# Patient Record
Sex: Male | Born: 1940 | Race: White | Hispanic: No | Marital: Married | Smoking: Never smoker
Health system: Southern US, Community
[De-identification: ages and names within clinical notes are randomized; demographics above are authoritative.]

## PROBLEM LIST (undated history)

## (undated) DIAGNOSIS — R350 Frequency of micturition: Secondary | ICD-10-CM

## (undated) DIAGNOSIS — Z8673 Personal history of transient ischemic attack (TIA), and cerebral infarction without residual deficits: Secondary | ICD-10-CM

## (undated) DIAGNOSIS — Z973 Presence of spectacles and contact lenses: Secondary | ICD-10-CM

## (undated) DIAGNOSIS — J45909 Unspecified asthma, uncomplicated: Secondary | ICD-10-CM

## (undated) DIAGNOSIS — I10 Essential (primary) hypertension: Secondary | ICD-10-CM

## (undated) DIAGNOSIS — E785 Hyperlipidemia, unspecified: Secondary | ICD-10-CM

## (undated) DIAGNOSIS — N400 Enlarged prostate without lower urinary tract symptoms: Secondary | ICD-10-CM

## (undated) DIAGNOSIS — N320 Bladder-neck obstruction: Secondary | ICD-10-CM

## (undated) DIAGNOSIS — E119 Type 2 diabetes mellitus without complications: Secondary | ICD-10-CM

## (undated) DIAGNOSIS — Z974 Presence of external hearing-aid: Secondary | ICD-10-CM

## (undated) DIAGNOSIS — J309 Allergic rhinitis, unspecified: Secondary | ICD-10-CM

## (undated) HISTORY — PX: TYMPANIC MEMBRANE REPAIR: SHX294

---

## 2000-06-27 ENCOUNTER — Encounter: Payer: Self-pay | Admitting: Internal Medicine

## 2000-06-27 ENCOUNTER — Encounter: Admission: RE | Admit: 2000-06-27 | Discharge: 2000-06-27 | Payer: Self-pay | Admitting: Internal Medicine

## 2001-06-27 ENCOUNTER — Encounter (INDEPENDENT_AMBULATORY_CARE_PROVIDER_SITE_OTHER): Payer: Self-pay | Admitting: *Deleted

## 2001-06-27 ENCOUNTER — Ambulatory Visit (HOSPITAL_COMMUNITY): Admission: RE | Admit: 2001-06-27 | Discharge: 2001-06-27 | Payer: Self-pay | Admitting: *Deleted

## 2002-05-18 ENCOUNTER — Encounter: Admission: RE | Admit: 2002-05-18 | Discharge: 2002-05-18 | Payer: Self-pay | Admitting: Internal Medicine

## 2002-05-18 ENCOUNTER — Encounter: Payer: Self-pay | Admitting: Internal Medicine

## 2010-11-06 ENCOUNTER — Ambulatory Visit
Admission: RE | Admit: 2010-11-06 | Discharge: 2010-11-06 | Payer: Self-pay | Source: Home / Self Care | Attending: Urology | Admitting: Urology

## 2010-11-06 HISTORY — PX: OTHER SURGICAL HISTORY: SHX169

## 2010-11-09 LAB — GLUCOSE, CAPILLARY: Glucose-Capillary: 104 mg/dL — ABNORMAL HIGH (ref 70–99)

## 2010-11-10 LAB — POCT I-STAT, CHEM 8
BUN: 15 mg/dL (ref 6–23)
Calcium, Ion: 1.14 mmol/L (ref 1.12–1.32)
Chloride: 107 mEq/L (ref 96–112)
Creatinine, Ser: 1.1 mg/dL (ref 0.4–1.5)
Glucose, Bld: 132 mg/dL — ABNORMAL HIGH (ref 70–99)
HCT: 43 % (ref 39.0–52.0)
Hemoglobin: 14.6 g/dL (ref 13.0–17.0)
Potassium: 4 mEq/L (ref 3.5–5.1)
Sodium: 139 mEq/L (ref 135–145)
TCO2: 26 mmol/L (ref 0–100)

## 2010-11-13 NOTE — Op Note (Signed)
  NAME:  Adrian Bailey, GRAS NO.:  [id number]  MEDICAL RECORD NO.:  [id number]          PATIENT TYPE:  AMB  LOCATION:  [facility]                         FACILITY:  [facility]  PHYSICIAN:  Maretta Bees. Vonita Moss, M.D.DATE OF BIRTH:  25-Mar-1941  DATE OF PROCEDURE:  11/06/2010 DATE OF DISCHARGE:                              OPERATIVE REPORT   PREOPERATIVE DIAGNOSIS:  Recurrent hematuria.  POSTOPERATIVE DIAGNOSIS:  Recurrent hematuria.  PROCEDURE:  Cystoscopy, fulguration of bladder neck bleeders and cystogram and collection of urine cytology.  SURGEON:  Maretta Bees. Vonita Moss, M.D.  ANESTHESIA:  General.  INDICATIONS:  This gentleman has had recurrent hematuria.  He has a large prostate and is being treated with a combination of Avodart and tamsulosin.  Previous cystoscopy revealed no evidence of bladder tumors. CT scan was unremarkable except for a large left-sided bladder diverticulum with poor visualization of the distal aspect.  I felt that cystoscopy with visualization of his diverticulum was necessary.  PROCEDURE:  The patient is brought to the operating room and placed in lithotomy position.  The external genitalia were prepped and draped in usual fashion.  He was cystoscoped and the anterior urethra was normal. The prostate had large lateral lobes and the bladder neck bled quite easily.  I needed to irrigate the bladder and fulgurate the bladder neck with the Bugbee electrode to lessen his hematuria.  I then cystoscoped him with the flexible scope and could not reach the opening of the diverticulum with the scope itself.  I then inserted a guidewire into the diverticulum, but still because of the large prostate and the fact that the diverticulum entry was high up on the left bladder wall, I still could not enter the diverticulum.  I ended up using a flexible ureteroscope and after manipulating the guidewire on a couple of occasions, I finally was able to direct the  ureteroscope into the diverticulum.  Visually and fluoroscopically, I could determine that I was able to visualize the distal aspect of this diverticulum.  I saw no definite lesion.  There was some sediment and might have explained for the poor filling.  It was hard to expand the diverticulum well with the slower flow with the ureteroscope.  There would be still a possibility that I was not able to adequately visualize a significant lesion.  Urine cytology with saline washings was sent to the pathology lab.  At this point, I drained the bladder with a Foley catheter and left it indwelling.  He was sent to recovery room in good condition.  Estimated blood loss was 50 mL.    Maretta Bees. Vonita Moss, M.D.    LJP/MEDQ  D:  11/06/2010  T:  11/06/2010  Job:  [id number]  Electronically Signed by Larey Dresser M.D. on 11/11/2010 11:35:14 AM

## 2011-03-05 NOTE — Op Note (Signed)
[place]. [facility]  Patient:    Adrian Bailey, Adrian Bailey Visit Number: [id number] MRN: [id number]          Service Type: END Location: ENDO Attending Physician:  Sabino Gasser Dictated by:   Sabino Gasser, M.D. Proc. Date: 06/27/01 Admit Date:  06/27/2001                             Operative Report  PROCEDURE:  Colonoscopy.  INDICATIONS:  Blood in stool.  ANESTHESIA:  Demerol 25, versed 2 mg.  PROCEDURE:  With the patient mildly sedated in the left lateral decubitus position the Olympus videoscopic colonoscope was inserted in the rectum and passed under direct vision to the cecum identified by ileocecal valve and appendiceal orifice, as with the endoscopic examination the processor and printer were not communicating, therefore pictures could not be taken but these appeared normal.  From this point the colonoscope was then slowly withdrawn taking circumferential views of the entire colonic mucosa, stopping only in the rectum which appeared normal on direct view and showed internal hemorrhoids on retroflexed view.  The endoscope was straightened and withdrawn.  The patients vital signs and pulse oximeter remained stable. The patient tolerated the procedure well without apparent complications.  FINDINGS:  Internal hemorrhoids, other unremarkable colonoscopic examination to the cecum.  PLAN:  Have patient followup with me as described in the endoscopy note. Dictated by:   Sabino Gasser, M.D. Attending Physician:  Sabino Gasser DD:  06/27/01 TD:  06/27/01 Job: [id number]

## 2011-03-05 NOTE — Procedures (Signed)
[place]. [facility]  Patient:    Adrian Bailey, Adrian Bailey Visit Number: [id number] MRN: [id number]          Service Type: END Location: ENDO Attending Physician:  Sabino Gasser Dictated by:   Sabino Gasser, M.D. Proc. Date: 06/27/01 Admit Date:  06/27/2001                             Procedure Report  PROCEDURE PERFORMED:  Upper endoscopy.  ENDOSCOPIST:  Sabino Gasser, M.D.  INDICATIONS FOR PROCEDURE:  Gastroesophageal reflux disease.  ANESTHESIA:  Demerol 50 mg, Versed 4 mg.  DESCRIPTION OF PROCEDURE:  With the patient mildly sedated in the left lateral decubitus position, the Olympus video endoscope was inserted in the mouth and passed under direct vision through the esophagus which appeared normal.  There was no evidence of Barretts.  We entered into the stomach.  Fundus, body, antrum were visualized.  The fundus and body were erythematous, consistent with a possible gastritis but otherwise normal.  The antrum showed patchy erythema, especially in the prepyloric area where there may have been small erosions seen.  Unfortunately, photographs could not be taken because of a technical problem with the processor and printer but this was biopsied in the prepyloric area.  We entered the duodenal bulb, which showed some minimal duodenitis and second portion of the duodenum appeared normal.  From this point, the endoscope was slowly withdrawn taking circumferential views of the entire duodenal mucosa until the endoscope had been pulled back into the stomach and placed on retroflexion to view the stomach from below and this appeared normal.  The endoscope was then straightened and withdrawn taking circumferential views of the remaining gastric mucosa and esophageal mucosa, which otherwise appeared normal.  Patients vital signs and pulse oximeter remained stable.  The patient tolerated the procedure well without apparent complications.  FINDINGS:  Essentially  examination other than changes of gastritis and some duodenitis which was mild.  PLAN:  Await biopsy report.  The patient will call me for results and follow up with me as an outpatient.  Proceed to colonoscopy as planned. Dictated by:   Sabino Gasser, M.D. Attending Physician:  Sabino Gasser DD:  06/27/01 TD:  06/27/01 Job: [id number]

## 2011-11-19 DIAGNOSIS — N401 Enlarged prostate with lower urinary tract symptoms: Secondary | ICD-10-CM | POA: Diagnosis not present

## 2011-11-19 DIAGNOSIS — R972 Elevated prostate specific antigen [PSA]: Secondary | ICD-10-CM | POA: Diagnosis not present

## 2011-11-19 DIAGNOSIS — N323 Diverticulum of bladder: Secondary | ICD-10-CM | POA: Diagnosis not present

## 2011-12-24 DIAGNOSIS — J309 Allergic rhinitis, unspecified: Secondary | ICD-10-CM | POA: Diagnosis not present

## 2011-12-31 DIAGNOSIS — J3089 Other allergic rhinitis: Secondary | ICD-10-CM | POA: Diagnosis not present

## 2011-12-31 DIAGNOSIS — J45909 Unspecified asthma, uncomplicated: Secondary | ICD-10-CM | POA: Diagnosis not present

## 2012-01-10 DIAGNOSIS — J309 Allergic rhinitis, unspecified: Secondary | ICD-10-CM | POA: Diagnosis not present

## 2012-01-19 DIAGNOSIS — J309 Allergic rhinitis, unspecified: Secondary | ICD-10-CM | POA: Diagnosis not present

## 2012-01-26 DIAGNOSIS — J309 Allergic rhinitis, unspecified: Secondary | ICD-10-CM | POA: Diagnosis not present

## 2012-02-04 DIAGNOSIS — J309 Allergic rhinitis, unspecified: Secondary | ICD-10-CM | POA: Diagnosis not present

## 2012-02-10 DIAGNOSIS — J309 Allergic rhinitis, unspecified: Secondary | ICD-10-CM | POA: Diagnosis not present

## 2012-02-18 DIAGNOSIS — J309 Allergic rhinitis, unspecified: Secondary | ICD-10-CM | POA: Diagnosis not present

## 2012-02-25 DIAGNOSIS — J309 Allergic rhinitis, unspecified: Secondary | ICD-10-CM | POA: Diagnosis not present

## 2012-03-16 DIAGNOSIS — J309 Allergic rhinitis, unspecified: Secondary | ICD-10-CM | POA: Diagnosis not present

## 2012-03-22 DIAGNOSIS — J309 Allergic rhinitis, unspecified: Secondary | ICD-10-CM | POA: Diagnosis not present

## 2012-03-29 DIAGNOSIS — J309 Allergic rhinitis, unspecified: Secondary | ICD-10-CM | POA: Diagnosis not present

## 2012-04-07 DIAGNOSIS — J309 Allergic rhinitis, unspecified: Secondary | ICD-10-CM | POA: Diagnosis not present

## 2012-04-12 DIAGNOSIS — J309 Allergic rhinitis, unspecified: Secondary | ICD-10-CM | POA: Diagnosis not present

## 2012-04-19 DIAGNOSIS — J309 Allergic rhinitis, unspecified: Secondary | ICD-10-CM | POA: Diagnosis not present

## 2012-04-26 DIAGNOSIS — J309 Allergic rhinitis, unspecified: Secondary | ICD-10-CM | POA: Diagnosis not present

## 2012-04-28 DIAGNOSIS — E7800 Pure hypercholesterolemia, unspecified: Secondary | ICD-10-CM | POA: Diagnosis not present

## 2012-04-28 DIAGNOSIS — J309 Allergic rhinitis, unspecified: Secondary | ICD-10-CM | POA: Diagnosis not present

## 2012-04-28 DIAGNOSIS — E119 Type 2 diabetes mellitus without complications: Secondary | ICD-10-CM | POA: Diagnosis not present

## 2012-05-03 DIAGNOSIS — J309 Allergic rhinitis, unspecified: Secondary | ICD-10-CM | POA: Diagnosis not present

## 2012-05-12 DIAGNOSIS — J309 Allergic rhinitis, unspecified: Secondary | ICD-10-CM | POA: Diagnosis not present

## 2012-05-19 DIAGNOSIS — J309 Allergic rhinitis, unspecified: Secondary | ICD-10-CM | POA: Diagnosis not present

## 2012-05-22 DIAGNOSIS — J309 Allergic rhinitis, unspecified: Secondary | ICD-10-CM | POA: Diagnosis not present

## 2012-05-24 DIAGNOSIS — J309 Allergic rhinitis, unspecified: Secondary | ICD-10-CM | POA: Diagnosis not present

## 2012-05-30 DIAGNOSIS — J309 Allergic rhinitis, unspecified: Secondary | ICD-10-CM | POA: Diagnosis not present

## 2012-06-15 DIAGNOSIS — J309 Allergic rhinitis, unspecified: Secondary | ICD-10-CM | POA: Diagnosis not present

## 2012-06-27 DIAGNOSIS — J309 Allergic rhinitis, unspecified: Secondary | ICD-10-CM | POA: Diagnosis not present

## 2012-07-07 DIAGNOSIS — J309 Allergic rhinitis, unspecified: Secondary | ICD-10-CM | POA: Diagnosis not present

## 2012-07-20 DIAGNOSIS — H5210 Myopia, unspecified eye: Secondary | ICD-10-CM | POA: Diagnosis not present

## 2012-07-20 DIAGNOSIS — H524 Presbyopia: Secondary | ICD-10-CM | POA: Diagnosis not present

## 2012-07-20 DIAGNOSIS — J309 Allergic rhinitis, unspecified: Secondary | ICD-10-CM | POA: Diagnosis not present

## 2012-07-20 DIAGNOSIS — H2510 Age-related nuclear cataract, unspecified eye: Secondary | ICD-10-CM | POA: Diagnosis not present

## 2012-07-20 DIAGNOSIS — Z23 Encounter for immunization: Secondary | ICD-10-CM | POA: Diagnosis not present

## 2012-07-20 DIAGNOSIS — H04129 Dry eye syndrome of unspecified lacrimal gland: Secondary | ICD-10-CM | POA: Diagnosis not present

## 2012-07-28 DIAGNOSIS — J309 Allergic rhinitis, unspecified: Secondary | ICD-10-CM | POA: Diagnosis not present

## 2012-08-01 DIAGNOSIS — J309 Allergic rhinitis, unspecified: Secondary | ICD-10-CM | POA: Diagnosis not present

## 2012-08-11 DIAGNOSIS — J309 Allergic rhinitis, unspecified: Secondary | ICD-10-CM | POA: Diagnosis not present

## 2012-08-18 DIAGNOSIS — J309 Allergic rhinitis, unspecified: Secondary | ICD-10-CM | POA: Diagnosis not present

## 2012-08-23 DIAGNOSIS — J309 Allergic rhinitis, unspecified: Secondary | ICD-10-CM | POA: Diagnosis not present

## 2012-09-06 DIAGNOSIS — J309 Allergic rhinitis, unspecified: Secondary | ICD-10-CM | POA: Diagnosis not present

## 2012-09-13 DIAGNOSIS — J309 Allergic rhinitis, unspecified: Secondary | ICD-10-CM | POA: Diagnosis not present

## 2012-09-20 DIAGNOSIS — G459 Transient cerebral ischemic attack, unspecified: Secondary | ICD-10-CM | POA: Diagnosis not present

## 2012-09-20 DIAGNOSIS — Z23 Encounter for immunization: Secondary | ICD-10-CM | POA: Diagnosis not present

## 2012-09-20 DIAGNOSIS — R42 Dizziness and giddiness: Secondary | ICD-10-CM | POA: Diagnosis not present

## 2012-09-21 ENCOUNTER — Encounter (HOSPITAL_COMMUNITY): Payer: Self-pay | Admitting: Internal Medicine

## 2012-09-21 ENCOUNTER — Observation Stay (HOSPITAL_COMMUNITY): Payer: BC Managed Care – PPO

## 2012-09-21 ENCOUNTER — Ambulatory Visit
Admission: RE | Admit: 2012-09-21 | Discharge: 2012-09-21 | Disposition: A | Discharge status: Home / Self Care | Payer: BC Managed Care – PPO | Source: Ambulatory Visit | Attending: Internal Medicine | Admitting: Internal Medicine

## 2012-09-21 ENCOUNTER — Observation Stay (HOSPITAL_COMMUNITY)
Admission: AD | Admit: 2012-09-21 | Discharge: 2012-09-22 | Disposition: A | Discharge status: Home / Self Care | Payer: BC Managed Care – PPO | Source: Ambulatory Visit | Attending: Internal Medicine | Admitting: Internal Medicine

## 2012-09-21 ENCOUNTER — Other Ambulatory Visit: Payer: Self-pay | Admitting: Internal Medicine

## 2012-09-21 DIAGNOSIS — R42 Dizziness and giddiness: Secondary | ICD-10-CM

## 2012-09-21 DIAGNOSIS — Z7901 Long term (current) use of anticoagulants: Secondary | ICD-10-CM | POA: Diagnosis not present

## 2012-09-21 DIAGNOSIS — I639 Cerebral infarction, unspecified: Secondary | ICD-10-CM | POA: Insufficient documentation

## 2012-09-21 DIAGNOSIS — Z79899 Other long term (current) drug therapy: Secondary | ICD-10-CM | POA: Insufficient documentation

## 2012-09-21 DIAGNOSIS — Z8673 Personal history of transient ischemic attack (TIA), and cerebral infarction without residual deficits: Secondary | ICD-10-CM | POA: Insufficient documentation

## 2012-09-21 DIAGNOSIS — E785 Hyperlipidemia, unspecified: Secondary | ICD-10-CM | POA: Diagnosis present

## 2012-09-21 DIAGNOSIS — R7309 Other abnormal glucose: Secondary | ICD-10-CM | POA: Insufficient documentation

## 2012-09-21 DIAGNOSIS — I10 Essential (primary) hypertension: Secondary | ICD-10-CM | POA: Diagnosis present

## 2012-09-21 DIAGNOSIS — G459 Transient cerebral ischemic attack, unspecified: Secondary | ICD-10-CM | POA: Diagnosis not present

## 2012-09-21 DIAGNOSIS — J449 Chronic obstructive pulmonary disease, unspecified: Secondary | ICD-10-CM | POA: Diagnosis not present

## 2012-09-21 HISTORY — DX: Essential (primary) hypertension: I10

## 2012-09-21 HISTORY — DX: Hyperlipidemia, unspecified: E78.5

## 2012-09-21 HISTORY — DX: Unspecified asthma, uncomplicated: J45.909

## 2012-09-21 LAB — GLUCOSE, CAPILLARY: Glucose-Capillary: 140 mg/dL — ABNORMAL HIGH (ref 70–99)

## 2012-09-21 LAB — CK TOTAL AND CKMB (NOT AT ARMC): Total CK: 83 U/L (ref 7–232)

## 2012-09-21 LAB — ANTITHROMBIN III: AntiThromb III Func: 113 % (ref 75–120)

## 2012-09-21 MED ORDER — SENNOSIDES-DOCUSATE SODIUM 8.6-50 MG PO TABS: 1.0000 | ORAL_TABLET | Freq: Every evening | ORAL | Status: DC | PRN

## 2012-09-21 MED ORDER — SODIUM CHLORIDE 0.9 % IJ SOLN
3.0000 mL | Freq: Two times a day (BID) | INTRAMUSCULAR | Status: DC
  Administered 2012-09-21 – 2012-09-22 (×2): 3 mL via INTRAVENOUS

## 2012-09-21 MED ORDER — OMEGA-3-ACID ETHYL ESTERS 1 G PO CAPS
2.0000 g | ORAL_CAPSULE | Freq: Every day | ORAL | Status: DC
  Administered 2012-09-22: 2 g via ORAL
  Filled 2012-09-21: qty 2

## 2012-09-21 MED ORDER — INSULIN ASPART 100 UNIT/ML ~~LOC~~ SOLN
0.0000 [IU] | Freq: Three times a day (TID) | SUBCUTANEOUS | Status: DC
  Administered 2012-09-22: 1 [IU] via SUBCUTANEOUS

## 2012-09-21 MED ORDER — INSULIN ASPART 100 UNIT/ML ~~LOC~~ SOLN: 0.0000 [IU] | Freq: Every day | SUBCUTANEOUS | Status: DC

## 2012-09-21 MED ORDER — IRBESARTAN 300 MG PO TABS
300.0000 mg | ORAL_TABLET | Freq: Every day | ORAL | Status: DC
  Administered 2012-09-21 – 2012-09-22 (×2): 300 mg via ORAL
  Filled 2012-09-21 (×2): qty 1

## 2012-09-21 MED ORDER — AMLODIPINE BESYLATE 2.5 MG PO TABS
2.5000 mg | ORAL_TABLET | Freq: Every day | ORAL | Status: DC
Start: 2012-09-21 — End: 2012-09-22
  Administered 2012-09-21: 2.5 mg via ORAL
  Filled 2012-09-21 (×2): qty 1

## 2012-09-21 MED ORDER — DUTASTERIDE 0.5 MG PO CAPS
0.5000 mg | ORAL_CAPSULE | Freq: Every day | ORAL | Status: DC
Start: 2012-09-21 — End: 2012-09-22
  Administered 2012-09-21: 0.5 mg via ORAL
  Filled 2012-09-21 (×2): qty 1

## 2012-09-21 MED ORDER — METFORMIN HCL 500 MG PO TABS
500.0000 mg | ORAL_TABLET | Freq: Every day | ORAL | Status: DC
  Filled 2012-09-21: qty 1

## 2012-09-21 MED ORDER — TAMSULOSIN HCL 0.4 MG PO CAPS
0.4000 mg | ORAL_CAPSULE | Freq: Every day | ORAL | Status: DC
  Filled 2012-09-21 (×2): qty 1

## 2012-09-21 MED ORDER — SIMVASTATIN 40 MG PO TABS
40.0000 mg | ORAL_TABLET | Freq: Every day | ORAL | Status: DC
  Administered 2012-09-21: 40 mg via ORAL
  Filled 2012-09-21 (×2): qty 1

## 2012-09-21 MED ORDER — OMEGA-3 FATTY ACIDS 1000 MG PO CAPS: 2.0000 g | ORAL_CAPSULE | Freq: Every day | ORAL | Status: DC

## 2012-09-21 NOTE — Consult Note (Signed)
NEURO HOSPITALIST CONSULT NOTE    Reason for Consult: Recurrent spells of vertigo and possible subacute stroke.  HPI:                                                                                                                                          Adrian Bailey is an 71 y.o. male history of stroke, hypertension and hyperlipidemia as well as glucose intolerance, who came to the emergency room for evaluation of recurrent spells of vertigo and nausea since 09/17/2012. Patient has not experienced focal weakness or numbness. He said no change in speech. His been on Plavix 75 mg per day. NIH stroke score at this point is 0. CT scan of his head showed equivocal subacute left cerebellar, as well as left occipital infarctions. Subsequent MRI study did not show any signs of infarction involving these areas. Changes seen on CT scan with likely to be secondary to CSF. 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 He has taken Dramamine since onset of his symptoms which appears to help to some extent.  Past Medical History  Diagnosis Date  . Hypertension   . Hyperlipidemia   . Asthma   . Hayfever     [name])  . Abnormal LFTs     u/s 05/18/2002 liver has diffusely increased echotexture wihtout evidence for a focal parnchymal abnormality, no evidence for intraor extrhepatic biliary ductal dilation and the gallbladder was normal sonographically without evidence ofr gallstone , gallbladder wall thickening or pericholecytic fluid  . Glucose intolerance (impaired glucose tolerance) dex 10/02/2007  . CVA (cerebral infarction)     "recent"/notes 09/21/2012  . Hayfever   . Stroke     "that's what they are testing me for today" (09/21/2012)  . Enlarged prostate     Past Surgical History  Procedure Date  . Tympanic membrane repair 1960's    "had hole in my right ear"    Family History  Problem Relation Age of Onset  .  Heart attack Father 68  . Congestive Heart Failure Father   . Heart attack Mother 28    Social History:  reports that he has never smoked. He has never used smokeless tobacco. He reports that he does not drink alcohol or use illicit drugs.  Allergies  Allergen Reactions  . Zithromax (Azithromycin) Other (See Comments)    "took RX; drove ~ to restaurant; passed out; woke up"    MEDICATIONS:  Prior to Admission:  Prescriptions prior to admission  Medication Sig Dispense Refill  . amLODipine (NORVASC) 2.5 MG tablet Take 2.5 mg by mouth at bedtime.      . Chlorpheniramine Maleate (ALLERGY PO) Take 1 tablet by mouth at bedtime.      . clopidogrel (PLAVIX) 75 MG tablet Take 75 mg by mouth at bedtime.      . dutasteride (AVODART) 0.5 MG capsule Take 0.5 mg by mouth at bedtime.      . fish oil-omega-3 fatty acids 1000 MG capsule Take 2 g by mouth daily.      . metFORMIN (GLUCOPHAGE) 500 MG tablet Take 500 mg by mouth at bedtime.      . simvastatin (ZOCOR) 40 MG tablet Take 40 mg by mouth every evening.      . Tamsulosin HCl (FLOMAX) 0.4 MG CAPS Take 0.4 mg by mouth at bedtime.      . valsartan (DIOVAN) 320 MG tablet Take 320 mg by mouth at bedtime.       Scheduled:   . amLODipine  2.5 mg Oral QHS  . dutasteride  0.5 mg Oral QHS  . insulin aspart  0-5 Units Subcutaneous QHS  . insulin aspart  0-9 Units Subcutaneous TID WC  . irbesartan  300 mg Oral Daily  . metFORMIN  500 mg Oral Q supper  . omega-3 acid ethyl esters  2 g Oral Daily  . simvastatin  40 mg Oral q1800  . sodium chloride  3 mL Intravenous Q12H  . Tamsulosin HCl  0.4 mg Oral QPC supper  . [DISCONTINUED] fish oil-omega-3 fatty acids  2 g Oral Daily   RUE:[id number]   Blood pressure 146/69, pulse 60, temperature 97.9 F (36.6 C), temperature source Oral, resp. rate 18, height 5\' 7"  (1.702 m),  weight 90.8 kg (200 lb 2.8 oz), SpO2 100.00%.  Neurologic Examination:                                                                                                      Mental Status: Alert, oriented, thought content appropriate.  Speech fluent without evidence of aphasia. Able to follow commands without difficulty. Cranial Nerves: II-Visual fields were normal. III/IV/VI-Pupils were equal and reacted. Extraocular movements were full and conjugate.    V/VII-no facial numbness and no facial weakness. VIII-normal. X-normal speech and symmetrical palatal movement. XII-midline tongue extension Motor: 5/5 bilaterally with normal tone and bulk Sensory: Normal throughout. Deep Tendon Reflexes: 1+ and symmetric. Plantars: Mute bilaterally Cerebellar: Normal finger-to-nose testing. Carotid auscultation: Normal   No results found for this basename: cbc, bmp, coags, chol, tri, ldl, hga1c    No results found for this or any previous visit (from the past 48 hour(s)).  Dg Chest 2 View  09/21/2012  *RADIOLOGY REPORT*  Clinical Data: History of stroke  CHEST - 2 VIEW  Comparison: None  Findings: No active infiltrate or effusion is seen.  The lungs are hyperaerated consistent with COPD.  There is mild peribronchial thickening which may indicate bronchitis.  The heart is within normal limits in size.  No  acute bony abnormality is seen.  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  No active lung disease.   Original Report Authenticated By: Dwyane Dee, M.D.    Ct Head Wo Contrast  09/21/2012  *RADIOLOGY REPORT*  Clinical Data: Vertigo for 5 days duration  CT HEAD WITHOUT CONTRAST  Technique:  Contiguous axial images were obtained from the base of the skull through the vertex without contrast.  Comparison: None.  Findings: The ventricles are normal in size and configuration. There is no mass effect, hemorrhage, extra-axial fluid collection, or midline shift.  There is an area of decreased attenuation  in the periphery of the left cerebellum measuring 2.3 x 1.5 cm in size.  There is also some rather subtle decreased attenuation in the medial left occipital lobe compared the right.  Recent infarct in these areas must be of concern given this appearance.  Elsewhere, gray-white compartments are normal.  The bony calvarium appears intact.  Mastoids on the left are clear. The mastoids on the right are hypoplastic.  There is extensive ethmoid and right sphenoid sinus disease.  IMPRESSION:   Areas concerning for potential recent infarcts  in the medial left occipital lobe and in the lateral left cerebellum. There is more decreased attenuation in the lateral left cerebellum than in the medial left occipital lobe.  There is no hemorrhage or mass effect.  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  Extensive paranasal sinus disease.   Original Report Authenticated By: [name], M.D.    Mr Brain Wo Contrast  09/21/2012  *RADIOLOGY REPORT*  Clinical Data:  Dizziness and vertigo for 5 days.  MRI BRAIN WITHOUT CONTRAST MRA HEAD WITHOUT CONTRAST  Technique: Multiplanar, multiecho pulse sequences of the brain and surrounding structures were obtained according to standard protocol without intravenous contrast.  Angiographic images of the head were obtained using MRA technique without contrast.  Comparison: 09/21/2012 CT.  No comparison MR.  MRI HEAD  Findings:  No acute infarct.  The questioned CT abnormality represents cerebrospinal fluid.  No intracranial hemorrhage.  No intracranial mass lesion detected on this unenhanced exam.  Mild atrophy without hydrocephalus.  Minimal white matter type changes probably related to small vessel disease.  Minimal cervical spondylotic changes.  Cervical medullary junction, pituitary region, pineal region and orbital structures unremarkable.  Pan paranasal sinus mucosal thickening.  Minimal right mastoid air cell opacification.  IMPRESSION: No acute infarct.  Pan paranasal sinus mucosal  thickening.  Please see above.  MRA HEAD  Findings: Anterior circulation without medium or large size vessel significant stenosis or occlusion.  Suggestion of tiny 1 mm aneurysm lateral aspect of the cavernous segment of the right internal carotid artery.  Minimal bulge M1 segment left middle cerebral artery probably represents origin of a vessel.  Middle cerebral artery mild branch vessel irregularity.  Fetal type origin of the left posterior cerebral artery.  Ectatic vertebral arteries.  Left vertebral artery is dominant. Two left PICAs.  Nonvisualization AICAs.  No significant stenosis basilar artery.  Irregular poorly delineated superior cerebellar arteries.  Poor delineation of the left posterior cerebral artery distal branches.  IMPRESSION: Intracranial atherosclerotic type changes predominant involving branch vessels as detailed above.  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   Original Report Authenticated By: Lacy Duverney, M.D.    Mr Mra Head/brain Wo Cm  09/21/2012  *RADIOLOGY REPORT*  Clinical Data:  Dizziness and vertigo for 5 days.  MRI BRAIN WITHOUT CONTRAST MRA HEAD WITHOUT CONTRAST  Technique: Multiplanar, multiecho pulse sequences of the brain and surrounding structures were obtained according to standard protocol without intravenous contrast.  Angiographic images of the head were obtained using MRA technique without contrast.  Comparison: 09/21/2012 CT.  No comparison MR.  MRI HEAD  Findings:  No acute infarct.  The questioned CT abnormality represents cerebrospinal fluid.  No intracranial hemorrhage.  No intracranial mass lesion detected on this unenhanced exam.  Mild atrophy without hydrocephalus.  Minimal white matter type changes probably related to small vessel disease.  Minimal cervical spondylotic changes.  Cervical medullary junction, pituitary region, pineal region and orbital structures unremarkable.  Pan paranasal sinus  mucosal thickening.  Minimal right mastoid air cell opacification.  IMPRESSION: No acute infarct.  Pan paranasal sinus mucosal thickening.  Please see above.  MRA HEAD  Findings: Anterior circulation without medium or large size vessel significant stenosis or occlusion.  Suggestion of tiny 1 mm aneurysm lateral aspect of the cavernous segment of the right internal carotid artery.  Minimal bulge M1 segment left middle cerebral artery probably represents origin of a vessel.  Middle cerebral artery mild branch vessel irregularity.  Fetal type origin of the left posterior cerebral artery.  Ectatic vertebral arteries.  Left vertebral artery is dominant. Two left PICAs.  Nonvisualization AICAs.  No significant stenosis basilar artery.  Irregular poorly delineated superior cerebellar arteries.  Poor delineation of the left posterior cerebral artery distal branches.  IMPRESSION: Intracranial atherosclerotic type changes predominant involving branch vessels as detailed above.  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   Original Report Authenticated By: Lacy Duverney, M.D.     Assessment/Plan: Recurrent spells of vertigo with associated nausea. Etiology is unclear. Patient had no specific focal neurologic deficits. Recurrent TIA is somewhat unlikely but cannot be completely ruled out.  Recommendations: 1. Carotid Doppler study, 2-D echocardiogram, hemoglobin A1c, fasting lipid panel. 2. Continue Plavix 75 mg per day. 3. Meclizine for vertigo 25 mg every 6 hours when necessary.  Venetia Maxon M.D. [facility] [phone]  09/21/2012, 8:58 PM

## 2012-09-21 NOTE — H&P (Signed)
Adrian Bailey is an 71 y.o. male.   Chief Complaint: stroke, dizziness HPI: 71 yo male with hypertension, hyperlipidemia, glucose intolerance apparently started to have dizzness starting Sunday am,  Stomach was upset, pt heaved some and it passed after 5-10 minutes.  On Monday he went to work and it came on again.  Pt thought he was going to throw up and then it went away after about ,  Similar events happened about August 3 years ago.  , at that point in time he pulled to the side of the road and had n/v, and this alasted about and then he had 2 more episodes.  Pt was seen by ENT then,  He tried dramamine and has never quite figured out what this was.  Pt is prsently not dizzy but had CT brain earlier to day that showed recent CVA  Past Medical History  Diagnosis Date  . Hypertension   . Hyperlipidemia   . Asthma   . Hayfever     [name])  . Abnormal LFTs     u/s 05/18/2002 liver has diffusely increased echotexture wihtout evidence for a focal parnchymal abnormality, no evidence for intraor extrhepatic biliary ductal dilation and the gallbladder was normal sonographically without evidence ofr gallstone , gallbladder wall thickening or pericholecytic fluid  . Glucose intolerance (impaired glucose tolerance) dex 10/02/2007    Past Surgical History  Procedure Date  . Perforated typanic membrane 1992    R tympanoplasty    Family History  Problem Relation Age of Onset  . Heart attack Father 37  . Congestive Heart Failure Father   . Heart attack Mother 14   Social History:  does not have a smoking history on file. He does not have any smokeless tobacco history on file. His alcohol and drug histories not on file.  Allergies:  Allergies  Allergen Reactions  . Zithromax (Azithromycin)     Near syncope    Medications Prior to Admission  Medication Sig Dispense Refill  . amLODipine (NORVASC) 2.5 MG tablet Take 2.5 mg by mouth at bedtime.      . clopidogrel  (PLAVIX) 75 MG tablet Take 75 mg by mouth at bedtime.      . dutasteride (AVODART) 0.5 MG capsule Take 0.5 mg by mouth at bedtime.      . fish oil-omega-3 fatty acids 1000 MG capsule Take 2 g by mouth daily.      . metFORMIN (GLUCOPHAGE) 500 MG tablet Take 500 mg by mouth at bedtime.      . simvastatin (ZOCOR) 40 MG tablet Take 40 mg by mouth every evening.      . Tamsulosin HCl (FLOMAX) 0.4 MG CAPS Take 0.4 mg by mouth at bedtime.      . valsartan (DIOVAN) 320 MG tablet Take 320 mg by mouth at bedtime.        No results found for this or any previous visit (from the past 48 hour(s)). Dg Chest 2 View  09/21/2012  *RADIOLOGY REPORT*  Clinical Data: History of stroke  CHEST - 2 VIEW  Comparison: None  Findings: No active infiltrate or effusion is seen.  The lungs are hyperaerated consistent with COPD.  There is mild peribronchial thickening which may indicate bronchitis.  The heart is within normal limits in size.  No acute bony abnormality is seen.  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  No active lung disease.   Original Report Authenticated By: Dwyane Dee, M.D.    Ct Head Wo  Contrast  09/21/2012  *RADIOLOGY REPORT*  Clinical Data: Vertigo for 5 days duration  CT HEAD WITHOUT CONTRAST  Technique:  Contiguous axial images were obtained from the base of the skull through the vertex without contrast.  Comparison: None.  Findings: The ventricles are normal in size and configuration. There is no mass effect, hemorrhage, extra-axial fluid collection, or midline shift.  There is an area of decreased attenuation in the periphery of the left cerebellum measuring 2.3 x 1.5 cm in size.  There is also some rather subtle decreased attenuation in the medial left occipital lobe compared the right.  Recent infarct in these areas must be of concern given this appearance.  Elsewhere, gray-white compartments are normal.  The bony calvarium appears intact.  Mastoids on the left are clear. The mastoids on the  right are hypoplastic.  There is extensive ethmoid and right sphenoid sinus disease.  IMPRESSION:   Areas concerning for potential recent infarcts  in the medial left occipital lobe and in the lateral left cerebellum. There is more decreased attenuation in the lateral left cerebellum than in the medial left occipital lobe.  There is no hemorrhage or mass effect.  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  Extensive paranasal sinus disease.   Original Report Authenticated By: [name], M.D.     Review of Systems  Constitutional: Negative for fever, chills, weight loss, malaise/fatigue and diaphoresis.  HENT: Negative for hearing loss, ear pain, nosebleeds, congestion, sore throat, neck pain, tinnitus and ear discharge.   Eyes: Negative for blurred vision, double vision, photophobia, pain, discharge and redness.  Respiratory: Negative for cough, hemoptysis, sputum production, shortness of breath, wheezing and stridor.   Cardiovascular: Negative for chest pain, palpitations, orthopnea, claudication, leg swelling and PND.  Gastrointestinal: Negative for heartburn, nausea, vomiting, abdominal pain, diarrhea, constipation, blood in stool and melena.  Genitourinary: Negative for dysuria, urgency, frequency, hematuria and flank pain.  Musculoskeletal: Negative for myalgias, back pain and falls.  Skin: Negative for itching and rash.  Neurological: Positive for dizziness. Negative for tingling, tremors, sensory change, speech change, focal weakness, seizures, loss of consciousness, weakness and headaches.  Endo/Heme/Allergies: Negative for environmental allergies and polydipsia. Does not bruise/bleed easily.  Psychiatric/Behavioral: Negative for depression, suicidal ideas, hallucinations, memory loss and substance abuse. The patient is not nervous/anxious and does not have insomnia.     Blood pressure 145/63, pulse 66, temperature 98.1 F (36.7 C), temperature source Oral, resp. rate 17, SpO2  97.00%. Physical Exam  Constitutional: He is oriented to person, place, and time. He appears well-developed and well-nourished. No distress.  HENT:  Head: Normocephalic and atraumatic.  Eyes: Conjunctivae normal and EOM are normal. Pupils are equal, round, and reactive to light. Right eye exhibits no discharge. Left eye exhibits no discharge. No scleral icterus.  Neck: Normal range of motion. Neck supple. No JVD present. No tracheal deviation present. No thyromegaly present.  Cardiovascular: Normal rate, regular rhythm, normal heart sounds and intact distal pulses.  Exam reveals no gallop and no friction rub.   No murmur heard. Respiratory: Effort normal and breath sounds normal. No stridor. No respiratory distress. He has no wheezes. He has no rales. He exhibits no tenderness.  GI: Soft. Bowel sounds are normal. He exhibits no distension and no mass. There is no tenderness. There is no rebound and no guarding.       Obese  Musculoskeletal: Normal range of motion. He exhibits no edema and no tenderness.  Lymphadenopathy:    He has no cervical  adenopathy.  Neurological: He is alert and oriented to person, place, and time. He has normal reflexes. He displays normal reflexes. No cranial nerve deficit. He exhibits normal muscle tone. Coordination normal.  Skin: Skin is warm and dry. No rash noted. He is not diaphoretic. No erythema. No pallor.  Psychiatric: He has a normal mood and affect. His behavior is normal. Judgment and thought content normal.     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

## 2012-09-22 ENCOUNTER — Encounter (HOSPITAL_COMMUNITY): Payer: Self-pay | Admitting: Internal Medicine

## 2012-09-22 DIAGNOSIS — Z79899 Other long term (current) drug therapy: Secondary | ICD-10-CM | POA: Diagnosis not present

## 2012-09-22 DIAGNOSIS — E785 Hyperlipidemia, unspecified: Secondary | ICD-10-CM | POA: Diagnosis not present

## 2012-09-22 DIAGNOSIS — R42 Dizziness and giddiness: Secondary | ICD-10-CM | POA: Diagnosis not present

## 2012-09-22 DIAGNOSIS — Z7901 Long term (current) use of anticoagulants: Secondary | ICD-10-CM | POA: Diagnosis not present

## 2012-09-22 DIAGNOSIS — R7309 Other abnormal glucose: Secondary | ICD-10-CM | POA: Diagnosis not present

## 2012-09-22 DIAGNOSIS — Z8673 Personal history of transient ischemic attack (TIA), and cerebral infarction without residual deficits: Secondary | ICD-10-CM | POA: Diagnosis not present

## 2012-09-22 HISTORY — PX: TRANSTHORACIC ECHOCARDIOGRAM: SHX275

## 2012-09-22 LAB — LUPUS ANTICOAGULANT PANEL: Lupus Anticoagulant: NOT DETECTED

## 2012-09-22 LAB — BETA-2-GLYCOPROTEIN I ABS, IGG/M/A
Beta-2 Glyco I IgG: 0 G Units (ref <20)
Beta-2-Glycoprotein I IgA: 6 A Units (ref <20)
Beta-2-Glycoprotein I IgM: 3 M Units (ref <20)

## 2012-09-22 LAB — HEMOGLOBIN A1C
Hgb A1c MFr Bld: 6.7 % — ABNORMAL HIGH (ref <5.7)
Mean Plasma Glucose: 146 mg/dL — ABNORMAL HIGH (ref <117)

## 2012-09-22 LAB — LIPID PANEL
Cholesterol: 148 mg/dL (ref 0–200)
LDL Cholesterol: 69 mg/dL (ref 0–99)
Triglycerides: 125 mg/dL (ref <150)
VLDL: 25 mg/dL (ref 0–40)

## 2012-09-22 LAB — PROTEIN S ACTIVITY: Protein S Activity: 84 % (ref 69–129)

## 2012-09-22 LAB — PROTEIN C ACTIVITY: Protein C Activity: 175 % — ABNORMAL HIGH (ref 75–133)

## 2012-09-22 MED ORDER — ATORVASTATIN CALCIUM 20 MG PO TABS
20.0000 mg | ORAL_TABLET | Freq: Every day | ORAL | Status: DC
  Filled 2012-09-22: qty 1

## 2012-09-22 NOTE — Progress Notes (Signed)
  Echocardiogram 2D Echocardiogram has been performed.  Adrian Bailey 09/22/2012, 10:07 AM

## 2012-09-22 NOTE — Discharge Summary (Signed)
Physician Discharge Summary  Patient ID: Adrian Bailey MRN: [id number] DOB/AGE: January 17, 1941 71 y.o.  Admit date: 09/21/2012 Discharge date: 09/22/2012  Admission Diagnoses:  Discharge Diagnoses:  Principal Problem:  *Vertigo Active Problems:  Hyperlipidemia  Hypertension  TIA (transient ischemic attack)   Discharged Condition: stable  Hospital Course:  Chief Complaint: stroke, dizziness  HPI: 71 yo male with hypertension, hyperlipidemia, glucose intolerance apparently started to have dizzness starting Sunday am, Stomach was upset, pt heaved some and it passed after 5-10 minutes. On Monday he went to work and it came on again. Pt thought he was going to throw up and then it went away after about , Similar events happened about August 3 years ago. , at that point in time he pulled to the side of the road and had n/v, and this alasted about and then he had 2 more episodes. Pt was seen by ENT then, He tried dramamine and has never quite figured out what this was. Pt is prsently not dizzy but had CT brain on 09/21/2012=>  Recent CVA  MRI brain/MRA => showed no evidence of CVA, carotid u/s and cardiac echo are pending at this time. Pt is presently asymtomatic,  ddx of symptoms include vertigo, vs tia, Neurology was consulted and agreed with work up and continuation of plavix.    Consults: neurology  Significant Diagnostic Studies: radiology: MRI: Brain, MRA Brain  Treatments: plavix  Discharge Exam: Blood pressure 124/62, pulse 59, temperature 98.3 F (36.8 C), temperature source Oral, resp. rate 18, height 5\' 7"  (1.702 m), weight 90.8 kg (200 lb 2.8 oz), SpO2 96.00%. Heent: anicteric Neck: no jvd, no bruit, no tm, no adenopathy Heart: rrr s1, s2, no m/g/r Lung: ctab Abd: soft, nt, nd,+bs Ext: no c/c/e Skin: no rash Lymph: no adenoapthy Neuro: nonfocal   Disposition: Final discharge disposition not confirmed  Discharge to [facility]  A/p: Dizziness likely seconday  to Tia, vertigo Meclizine didn't reallly help in the past, pt is asymtomatic presently Cont plavix in lieu of aspirin  Hypertension: cont amlodipien, diovan  Hyperlipidemia: cont simvastatin  Bph: cont Jayln  Glucose intolerance: cont metformin.        Medication List     As of 09/22/2012  7:11 AM    TAKE these medications         ALLERGY PO   Take 1 tablet by mouth at bedtime.      amLODipine 2.5 MG tablet   Commonly known as: NORVASC   Take 2.5 mg by mouth at bedtime.      clopidogrel 75 MG tablet   Commonly known as: PLAVIX   Take 75 mg by mouth at bedtime.      dutasteride 0.5 MG capsule   Commonly known as: AVODART   Take 0.5 mg by mouth at bedtime.      fish oil-omega-3 fatty acids 1000 MG capsule   Take 2 g by mouth daily.      metFORMIN 500 MG tablet   Commonly known as: GLUCOPHAGE   Take 500 mg by mouth at bedtime.      simvastatin 40 MG tablet   Commonly known as: ZOCOR   Take 40 mg by mouth every evening.      Tamsulosin HCl 0.4 MG Caps   Commonly known as: FLOMAX   Take 0.4 mg by mouth at bedtime.      valsartan 320 MG tablet   Commonly known as: DIOVAN   Take 320 mg by mouth at bedtime.  Follow-up Information    Follow up with Pearson Grippe, MD. Schedule an appointment as soon as possible for a visit in 3 weeks.   Contact information:   [address] [place] [address] [phone]          Signed: Pearson Grippe 09/22/2012, 7:11 AM

## 2012-09-22 NOTE — Progress Notes (Signed)
Pt discharged home with with. Discharge instructions and education given both pt and wife demonstrate   understanding  with 2-3 teach back  stating s/s of stroke and 2 risk factors . Vs at discharge is BP 120/70 HR 64 R 18 Temp 97.7 F. Condition at d/c is stable

## 2012-09-22 NOTE — Progress Notes (Signed)
VASCULAR LAB PRELIMINARY  PRELIMINARY  PRELIMINARY  PRELIMINARY  Carotid duplex completed.    Preliminary report Bilateral:  No evidence of hemodynamically significant internal carotid artery stenosis.   Vertebral artery flow is antegrade.     Adrian Bailey, RVS 09/22/2012, 9:25 AM

## 2012-09-22 NOTE — Evaluation (Signed)
Physical Therapy Evaluation Patient Details Name: Adrian Bailey MRN: [id number] DOB: Mar 24, 1941 Today's Date: 09/22/2012 Time: 4098-1191 PT Time Calculation (min): 16 min  PT Assessment / Plan / Recommendation Clinical Impression  71 yo adm with h/o nausea and dizziness/vertigo. Head CT with ?cerebellar CVA, however MRI showed no infarct. Pt assessed for peripheral vestibular pathology with all tests negative. No further PT needs identified. All pt' and wife's questions answered and pt appreciative of consult.    PT Assessment  Patent does not need any further PT services    Follow Up Recommendations  No PT follow up    Does the patient have the potential to tolerate intense rehabilitation      Barriers to Discharge        Equipment Recommendations  None recommended by PT    Recommendations for Other Services     Frequency      Precautions / Restrictions Precautions Precautions: None   Pertinent Vitals/Pain Denied pain or dizziness      Mobility  Bed Mobility Bed Mobility: Supine to Sit;Sit to Supine;Sitting - Scoot to Edge of Bed Supine to Sit: 7: Independent Sitting - Scoot to Edge of Bed: 7: Independent Sit to Supine: 7: Independent Transfers Transfers: Not assessed Ambulation/Gait Ambulation/Gait Assistance: Not tested (comment)    Shoulder Instructions     Exercises Other Exercises Other Exercises: vestibular evaluation completed after explanation of role of vestibular system and potential role in his symptoms. Hallpike-Dix negative bil. Supine head roll test neg bil. Head thrust negative bil and pt demonstrated intact VOR. No nystagmus or symptoms triggered with any movements.   PT Diagnosis:    PT Problem List:   PT Treatment Interventions:     PT Goals    Visit Information  Last PT Received On: 09/22/12 Assistance Needed: +1    Subjective Data  Subjective: Pt reports he's had spells of vertigo that last seconds off/on for years. Usually lies  down and takes a nap and it's gone. Reports he had tympanoplasty 50 yrs ago and not sure what caused ruptured ear drum. Reports slight decline in bil hearing x 6-8 weeks (has had to adjust his hearing aids). Denies symptoms currently. Patient Stated Goal: go home today   Prior Functioning  Home Living Lives With: Spouse Available Help at Discharge: Family Additional Comments: Full envrionment not assessed as focus was vestibular evaluation and pt doing well. Prior Function Level of Independence: Independent Able to Take Stairs?: Reciprically Driving: Yes    Cognition  Overall Cognitive Status: Appears within functional limits for tasks assessed/performed Arousal/Alertness: Awake/alert Orientation Level: Oriented X4 / Intact Behavior During Session: [facility] for tasks performed    Extremity/Trunk Assessment     Balance    End of Session PT - End of Session Activity Tolerance: Patient tolerated treatment well Patient left: in bed;with call bell/phone within reach;with family/visitor present;Other (comment) (with SLP)  GP Functional Assessment Tool Used: clnicial judgment Functional Limitation: Mobility: Walking and moving around Mobility: Walking and Moving Around Current Status [phone]): 0 percent impaired, limited or restricted Mobility: Walking and Moving Around Goal Status [phone]): 0 percent impaired, limited or restricted Mobility: Walking and Moving Around Discharge Status [phone]): 0 percent impaired, limited or restricted   Adelia Baptista 09/22/2012, 11:51 AM  Pager [phone]

## 2012-09-22 NOTE — Evaluation (Signed)
Speech Language Pathology Evaluation Patient Details Name: Adrian Bailey MRN: [id number] DOB: 05/11/1941 Today's Date: 09/22/2012 Time: 4782-9562 SLP Time Calculation (min): 8 min  Problem List:  Patient Active Problem List  Diagnosis  . Hyperlipidemia  . Hypertension  . Vertigo  . TIA (transient ischemic attack)   Past Medical History:  Past Medical History  Diagnosis Date  . Hypertension   . Hyperlipidemia   . Asthma   . Hayfever     Adrian Bailey)  . Abnormal LFTs     u/s 05/18/2002 liver has diffusely increased echotexture wihtout evidence for a focal parnchymal abnormality, no evidence for intraor extrhepatic biliary ductal dilation and the gallbladder was normal sonographically without evidence ofr gallstone , gallbladder wall thickening or pericholecytic fluid  . Glucose intolerance (impaired glucose tolerance) dex 10/02/2007  . Hayfever   . Enlarged prostate   . TIA (transient ischemic attack)    Past Surgical History:  Past Surgical History  Procedure Date  . Tympanic membrane repair 1960's    "had hole in my right ear"   HPI:  71 yo adm with h/o nausea and dizziness/vertigo. Head CT with ?cerebellar CVA, however MRI showed no infarct.    Assessment / Plan / Recommendation Clinical Impression  Cognitive-linguistic function WFL for all tasks assessed. No further SLP needs indicated at this time.     SLP Assessment  Patient does not need any further Speech Lanaguage Pathology Services    Follow Up Recommendations  None       Pertinent Vitals/Pain n/a       SLP Evaluation Prior Functioning  Cognitive/Linguistic Baseline: Within functional limits Lives With: Spouse Available Help at Discharge: Family   Cognition  Overall Cognitive Status: Appears within functional limits for tasks assessed Arousal/Alertness: Awake/alert    Comprehension  Auditory Comprehension Overall Auditory Comprehension: Appears within functional limits for tasks  assessed Visual Recognition/Discrimination Discrimination: Within Function Limits Reading Comprehension Reading Status: Not tested    Expression Expression Primary Mode of Expression: Verbal Verbal Expression Overall Verbal Expression: Appears within functional limits for tasks assessed   Oral / Motor Oral Motor/Sensory Function Overall Oral Motor/Sensory Function: Appears within functional limits for tasks assessed Motor Speech Overall Motor Speech: Appears within functional limits for tasks assessed   GO Functional Assessment Tool Used: skilled clinical judgement Functional Limitations: Spoken language expressive Spoken Language Comprehension Current Status [phone]): 0 percent impaired, limited or restricted Spoken Language Comprehension Goal Status (V7846): 0 percent impaired, limited or restricted Spoken Language Comprehension Discharge Status [phone]): 0 percent impaired, limited or restricted Spoken Language Expression Current Status (M8413): 0 percent impaired, limited or restricted Spoken Language Expression Goal Status (K4401): 0 percent impaired, limited or restricted Spoken Language Expression Discharge Status [phone]): 0 percent impaired, limited or restricted  Adrian Lango MA, CCC-SLP [phone]  Adrian Bailey 09/22/2012, 12:13 PM

## 2012-09-25 LAB — PROTEIN C, TOTAL: Protein C, Total: 94 % (ref 72–160)

## 2012-09-28 DIAGNOSIS — J309 Allergic rhinitis, unspecified: Secondary | ICD-10-CM | POA: Diagnosis not present

## 2012-10-13 DIAGNOSIS — J309 Allergic rhinitis, unspecified: Secondary | ICD-10-CM | POA: Diagnosis not present

## 2012-10-17 DIAGNOSIS — J309 Allergic rhinitis, unspecified: Secondary | ICD-10-CM | POA: Diagnosis not present

## 2012-10-31 DIAGNOSIS — R197 Diarrhea, unspecified: Secondary | ICD-10-CM | POA: Diagnosis not present

## 2012-10-31 DIAGNOSIS — Z1211 Encounter for screening for malignant neoplasm of colon: Secondary | ICD-10-CM | POA: Diagnosis not present

## 2012-11-03 DIAGNOSIS — I10 Essential (primary) hypertension: Secondary | ICD-10-CM | POA: Diagnosis not present

## 2012-11-03 DIAGNOSIS — E119 Type 2 diabetes mellitus without complications: Secondary | ICD-10-CM | POA: Diagnosis not present

## 2012-11-03 DIAGNOSIS — D649 Anemia, unspecified: Secondary | ICD-10-CM | POA: Diagnosis not present

## 2012-11-03 DIAGNOSIS — E7800 Pure hypercholesterolemia, unspecified: Secondary | ICD-10-CM | POA: Diagnosis not present

## 2013-03-09 DIAGNOSIS — E119 Type 2 diabetes mellitus without complications: Secondary | ICD-10-CM | POA: Diagnosis not present

## 2013-03-09 DIAGNOSIS — E7800 Pure hypercholesterolemia, unspecified: Secondary | ICD-10-CM | POA: Diagnosis not present

## 2013-03-16 DIAGNOSIS — I10 Essential (primary) hypertension: Secondary | ICD-10-CM | POA: Diagnosis not present

## 2013-03-16 DIAGNOSIS — D649 Anemia, unspecified: Secondary | ICD-10-CM | POA: Diagnosis not present

## 2013-03-16 DIAGNOSIS — E7800 Pure hypercholesterolemia, unspecified: Secondary | ICD-10-CM | POA: Diagnosis not present

## 2013-03-16 DIAGNOSIS — E119 Type 2 diabetes mellitus without complications: Secondary | ICD-10-CM | POA: Diagnosis not present

## 2013-03-23 DIAGNOSIS — J3089 Other allergic rhinitis: Secondary | ICD-10-CM | POA: Diagnosis not present

## 2013-03-23 DIAGNOSIS — J45909 Unspecified asthma, uncomplicated: Secondary | ICD-10-CM | POA: Diagnosis not present

## 2013-05-10 DIAGNOSIS — R310 Gross hematuria: Secondary | ICD-10-CM | POA: Diagnosis not present

## 2013-05-10 DIAGNOSIS — N401 Enlarged prostate with lower urinary tract symptoms: Secondary | ICD-10-CM | POA: Diagnosis not present

## 2013-05-10 DIAGNOSIS — R319 Hematuria, unspecified: Secondary | ICD-10-CM | POA: Diagnosis not present

## 2013-05-10 DIAGNOSIS — N323 Diverticulum of bladder: Secondary | ICD-10-CM | POA: Diagnosis not present

## 2013-05-16 DIAGNOSIS — R310 Gross hematuria: Secondary | ICD-10-CM | POA: Diagnosis not present

## 2013-05-24 DIAGNOSIS — N401 Enlarged prostate with lower urinary tract symptoms: Secondary | ICD-10-CM | POA: Diagnosis not present

## 2013-05-24 DIAGNOSIS — R310 Gross hematuria: Secondary | ICD-10-CM | POA: Diagnosis not present

## 2013-05-24 DIAGNOSIS — N323 Diverticulum of bladder: Secondary | ICD-10-CM | POA: Diagnosis not present

## 2013-09-19 DIAGNOSIS — E119 Type 2 diabetes mellitus without complications: Secondary | ICD-10-CM | POA: Diagnosis not present

## 2013-09-19 DIAGNOSIS — I10 Essential (primary) hypertension: Secondary | ICD-10-CM | POA: Diagnosis not present

## 2013-09-19 DIAGNOSIS — E7800 Pure hypercholesterolemia, unspecified: Secondary | ICD-10-CM | POA: Diagnosis not present

## 2013-10-17 DIAGNOSIS — J329 Chronic sinusitis, unspecified: Secondary | ICD-10-CM | POA: Diagnosis not present

## 2013-10-17 DIAGNOSIS — R42 Dizziness and giddiness: Secondary | ICD-10-CM | POA: Diagnosis not present

## 2013-10-17 DIAGNOSIS — I6789 Other cerebrovascular disease: Secondary | ICD-10-CM | POA: Diagnosis not present

## 2013-10-17 DIAGNOSIS — R062 Wheezing: Secondary | ICD-10-CM | POA: Diagnosis not present

## 2013-10-17 DIAGNOSIS — E785 Hyperlipidemia, unspecified: Secondary | ICD-10-CM | POA: Diagnosis not present

## 2013-10-17 DIAGNOSIS — R05 Cough: Secondary | ICD-10-CM | POA: Diagnosis not present

## 2013-10-17 DIAGNOSIS — J019 Acute sinusitis, unspecified: Secondary | ICD-10-CM | POA: Diagnosis not present

## 2013-10-17 DIAGNOSIS — E119 Type 2 diabetes mellitus without complications: Secondary | ICD-10-CM | POA: Diagnosis not present

## 2013-10-24 DIAGNOSIS — J019 Acute sinusitis, unspecified: Secondary | ICD-10-CM | POA: Diagnosis not present

## 2013-10-24 DIAGNOSIS — J449 Chronic obstructive pulmonary disease, unspecified: Secondary | ICD-10-CM | POA: Diagnosis not present

## 2013-10-24 DIAGNOSIS — J45909 Unspecified asthma, uncomplicated: Secondary | ICD-10-CM | POA: Diagnosis not present

## 2013-10-24 DIAGNOSIS — J3089 Other allergic rhinitis: Secondary | ICD-10-CM | POA: Diagnosis not present

## 2013-11-19 DIAGNOSIS — J309 Allergic rhinitis, unspecified: Secondary | ICD-10-CM | POA: Diagnosis not present

## 2013-12-20 DIAGNOSIS — J309 Allergic rhinitis, unspecified: Secondary | ICD-10-CM | POA: Diagnosis not present

## 2013-12-25 DIAGNOSIS — J309 Allergic rhinitis, unspecified: Secondary | ICD-10-CM | POA: Diagnosis not present

## 2014-01-03 DIAGNOSIS — J309 Allergic rhinitis, unspecified: Secondary | ICD-10-CM | POA: Diagnosis not present

## 2014-01-07 ENCOUNTER — Other Ambulatory Visit: Payer: Self-pay | Admitting: Allergy and Immunology

## 2014-01-07 ENCOUNTER — Ambulatory Visit
Admission: RE | Admit: 2014-01-07 | Discharge: 2014-01-07 | Disposition: A | Discharge status: Home / Self Care | Payer: Medicare Other | Source: Ambulatory Visit | Attending: Allergy and Immunology | Admitting: Allergy and Immunology

## 2014-01-07 DIAGNOSIS — J3089 Other allergic rhinitis: Secondary | ICD-10-CM | POA: Diagnosis not present

## 2014-01-07 DIAGNOSIS — J45901 Unspecified asthma with (acute) exacerbation: Secondary | ICD-10-CM

## 2014-01-07 DIAGNOSIS — J209 Acute bronchitis, unspecified: Secondary | ICD-10-CM | POA: Diagnosis not present

## 2014-01-07 DIAGNOSIS — J45909 Unspecified asthma, uncomplicated: Secondary | ICD-10-CM | POA: Diagnosis not present

## 2014-01-07 DIAGNOSIS — J9819 Other pulmonary collapse: Secondary | ICD-10-CM | POA: Diagnosis not present

## 2014-01-08 ENCOUNTER — Other Ambulatory Visit: Payer: Self-pay | Admitting: Allergy and Immunology

## 2014-01-08 DIAGNOSIS — R9389 Abnormal findings on diagnostic imaging of other specified body structures: Secondary | ICD-10-CM

## 2014-01-09 ENCOUNTER — Ambulatory Visit
Admission: RE | Admit: 2014-01-09 | Discharge: 2014-01-09 | Disposition: A | Discharge status: Home / Self Care | Payer: Medicare Other | Source: Ambulatory Visit | Attending: Allergy and Immunology | Admitting: Allergy and Immunology

## 2014-01-09 DIAGNOSIS — R9389 Abnormal findings on diagnostic imaging of other specified body structures: Secondary | ICD-10-CM

## 2014-01-09 DIAGNOSIS — J984 Other disorders of lung: Secondary | ICD-10-CM | POA: Diagnosis not present

## 2014-01-10 DIAGNOSIS — J309 Allergic rhinitis, unspecified: Secondary | ICD-10-CM | POA: Diagnosis not present

## 2014-01-14 DIAGNOSIS — J309 Allergic rhinitis, unspecified: Secondary | ICD-10-CM | POA: Diagnosis not present

## 2014-01-17 DIAGNOSIS — H908 Mixed conductive and sensorineural hearing loss, unspecified: Secondary | ICD-10-CM | POA: Diagnosis not present

## 2014-01-21 DIAGNOSIS — J45909 Unspecified asthma, uncomplicated: Secondary | ICD-10-CM | POA: Diagnosis not present

## 2014-01-21 DIAGNOSIS — J309 Allergic rhinitis, unspecified: Secondary | ICD-10-CM | POA: Diagnosis not present

## 2014-01-21 DIAGNOSIS — J3089 Other allergic rhinitis: Secondary | ICD-10-CM | POA: Diagnosis not present

## 2014-01-28 DIAGNOSIS — J309 Allergic rhinitis, unspecified: Secondary | ICD-10-CM | POA: Diagnosis not present

## 2014-02-04 DIAGNOSIS — H908 Mixed conductive and sensorineural hearing loss, unspecified: Secondary | ICD-10-CM | POA: Diagnosis not present

## 2014-02-04 DIAGNOSIS — J309 Allergic rhinitis, unspecified: Secondary | ICD-10-CM | POA: Diagnosis not present

## 2014-02-04 DIAGNOSIS — H9319 Tinnitus, unspecified ear: Secondary | ICD-10-CM | POA: Diagnosis not present

## 2014-02-11 DIAGNOSIS — J309 Allergic rhinitis, unspecified: Secondary | ICD-10-CM | POA: Diagnosis not present

## 2014-04-10 DIAGNOSIS — J309 Allergic rhinitis, unspecified: Secondary | ICD-10-CM | POA: Diagnosis not present

## 2014-04-22 DIAGNOSIS — J309 Allergic rhinitis, unspecified: Secondary | ICD-10-CM | POA: Diagnosis not present

## 2014-04-29 DIAGNOSIS — J309 Allergic rhinitis, unspecified: Secondary | ICD-10-CM | POA: Diagnosis not present

## 2014-05-08 DIAGNOSIS — J309 Allergic rhinitis, unspecified: Secondary | ICD-10-CM | POA: Diagnosis not present

## 2014-05-13 DIAGNOSIS — J309 Allergic rhinitis, unspecified: Secondary | ICD-10-CM | POA: Diagnosis not present

## 2014-05-20 DIAGNOSIS — J309 Allergic rhinitis, unspecified: Secondary | ICD-10-CM | POA: Diagnosis not present

## 2014-05-22 DIAGNOSIS — J309 Allergic rhinitis, unspecified: Secondary | ICD-10-CM | POA: Diagnosis not present

## 2014-05-27 DIAGNOSIS — J309 Allergic rhinitis, unspecified: Secondary | ICD-10-CM | POA: Diagnosis not present

## 2014-06-03 DIAGNOSIS — E119 Type 2 diabetes mellitus without complications: Secondary | ICD-10-CM | POA: Diagnosis not present

## 2014-06-03 DIAGNOSIS — E7800 Pure hypercholesterolemia, unspecified: Secondary | ICD-10-CM | POA: Diagnosis not present

## 2014-06-03 DIAGNOSIS — N139 Obstructive and reflux uropathy, unspecified: Secondary | ICD-10-CM | POA: Diagnosis not present

## 2014-06-03 DIAGNOSIS — N323 Diverticulum of bladder: Secondary | ICD-10-CM | POA: Diagnosis not present

## 2014-06-03 DIAGNOSIS — I10 Essential (primary) hypertension: Secondary | ICD-10-CM | POA: Diagnosis not present

## 2014-06-03 DIAGNOSIS — J309 Allergic rhinitis, unspecified: Secondary | ICD-10-CM | POA: Diagnosis not present

## 2014-06-03 DIAGNOSIS — Z125 Encounter for screening for malignant neoplasm of prostate: Secondary | ICD-10-CM | POA: Diagnosis not present

## 2014-06-03 DIAGNOSIS — N138 Other obstructive and reflux uropathy: Secondary | ICD-10-CM | POA: Diagnosis not present

## 2014-06-03 DIAGNOSIS — N401 Enlarged prostate with lower urinary tract symptoms: Secondary | ICD-10-CM | POA: Diagnosis not present

## 2014-06-05 DIAGNOSIS — J309 Allergic rhinitis, unspecified: Secondary | ICD-10-CM | POA: Diagnosis not present

## 2014-06-10 DIAGNOSIS — J309 Allergic rhinitis, unspecified: Secondary | ICD-10-CM | POA: Diagnosis not present

## 2014-06-18 DIAGNOSIS — J309 Allergic rhinitis, unspecified: Secondary | ICD-10-CM | POA: Diagnosis not present

## 2014-06-25 DIAGNOSIS — J309 Allergic rhinitis, unspecified: Secondary | ICD-10-CM | POA: Diagnosis not present

## 2014-07-19 DIAGNOSIS — J3089 Other allergic rhinitis: Secondary | ICD-10-CM | POA: Diagnosis not present

## 2014-07-22 DIAGNOSIS — J3089 Other allergic rhinitis: Secondary | ICD-10-CM | POA: Diagnosis not present

## 2014-07-29 DIAGNOSIS — J3089 Other allergic rhinitis: Secondary | ICD-10-CM | POA: Diagnosis not present

## 2014-07-29 DIAGNOSIS — E785 Hyperlipidemia, unspecified: Secondary | ICD-10-CM | POA: Diagnosis not present

## 2014-07-29 DIAGNOSIS — E119 Type 2 diabetes mellitus without complications: Secondary | ICD-10-CM | POA: Diagnosis not present

## 2014-07-29 DIAGNOSIS — I10 Essential (primary) hypertension: Secondary | ICD-10-CM | POA: Diagnosis not present

## 2014-07-30 DIAGNOSIS — I10 Essential (primary) hypertension: Secondary | ICD-10-CM | POA: Diagnosis not present

## 2014-07-30 DIAGNOSIS — E119 Type 2 diabetes mellitus without complications: Secondary | ICD-10-CM | POA: Diagnosis not present

## 2014-08-08 DIAGNOSIS — J3089 Other allergic rhinitis: Secondary | ICD-10-CM | POA: Diagnosis not present

## 2014-08-09 DIAGNOSIS — Z23 Encounter for immunization: Secondary | ICD-10-CM | POA: Diagnosis not present

## 2014-08-13 DIAGNOSIS — J3089 Other allergic rhinitis: Secondary | ICD-10-CM | POA: Diagnosis not present

## 2014-08-22 DIAGNOSIS — J3089 Other allergic rhinitis: Secondary | ICD-10-CM | POA: Diagnosis not present

## 2014-08-27 DIAGNOSIS — J3089 Other allergic rhinitis: Secondary | ICD-10-CM | POA: Diagnosis not present

## 2014-09-05 DIAGNOSIS — J3089 Other allergic rhinitis: Secondary | ICD-10-CM | POA: Diagnosis not present

## 2014-09-09 DIAGNOSIS — J3089 Other allergic rhinitis: Secondary | ICD-10-CM | POA: Diagnosis not present

## 2014-09-16 DIAGNOSIS — J3089 Other allergic rhinitis: Secondary | ICD-10-CM | POA: Diagnosis not present

## 2014-09-26 DIAGNOSIS — J3089 Other allergic rhinitis: Secondary | ICD-10-CM | POA: Diagnosis not present

## 2014-10-03 DIAGNOSIS — J3089 Other allergic rhinitis: Secondary | ICD-10-CM | POA: Diagnosis not present

## 2014-10-09 DIAGNOSIS — J3089 Other allergic rhinitis: Secondary | ICD-10-CM | POA: Diagnosis not present

## 2014-10-14 DIAGNOSIS — J3089 Other allergic rhinitis: Secondary | ICD-10-CM | POA: Diagnosis not present

## 2014-11-04 DIAGNOSIS — J3089 Other allergic rhinitis: Secondary | ICD-10-CM | POA: Diagnosis not present

## 2014-11-14 DIAGNOSIS — J3089 Other allergic rhinitis: Secondary | ICD-10-CM | POA: Diagnosis not present

## 2014-11-18 DIAGNOSIS — J3089 Other allergic rhinitis: Secondary | ICD-10-CM | POA: Diagnosis not present

## 2014-11-25 DIAGNOSIS — J3089 Other allergic rhinitis: Secondary | ICD-10-CM | POA: Diagnosis not present

## 2014-11-29 DIAGNOSIS — J3089 Other allergic rhinitis: Secondary | ICD-10-CM | POA: Diagnosis not present

## 2014-12-17 DIAGNOSIS — J3089 Other allergic rhinitis: Secondary | ICD-10-CM | POA: Diagnosis not present

## 2014-12-20 DIAGNOSIS — J3089 Other allergic rhinitis: Secondary | ICD-10-CM | POA: Diagnosis not present

## 2014-12-26 DIAGNOSIS — J3089 Other allergic rhinitis: Secondary | ICD-10-CM | POA: Diagnosis not present

## 2015-01-01 DIAGNOSIS — J3089 Other allergic rhinitis: Secondary | ICD-10-CM | POA: Diagnosis not present

## 2015-01-07 DIAGNOSIS — J3089 Other allergic rhinitis: Secondary | ICD-10-CM | POA: Diagnosis not present

## 2015-01-13 DIAGNOSIS — J3089 Other allergic rhinitis: Secondary | ICD-10-CM | POA: Diagnosis not present

## 2015-01-20 DIAGNOSIS — J3089 Other allergic rhinitis: Secondary | ICD-10-CM | POA: Diagnosis not present

## 2015-01-23 DIAGNOSIS — H908 Mixed conductive and sensorineural hearing loss, unspecified: Secondary | ICD-10-CM | POA: Diagnosis not present

## 2015-01-27 DIAGNOSIS — J3089 Other allergic rhinitis: Secondary | ICD-10-CM | POA: Diagnosis not present

## 2015-02-03 DIAGNOSIS — J3089 Other allergic rhinitis: Secondary | ICD-10-CM | POA: Diagnosis not present

## 2015-02-10 DIAGNOSIS — J3089 Other allergic rhinitis: Secondary | ICD-10-CM | POA: Diagnosis not present

## 2015-02-18 DIAGNOSIS — J3089 Other allergic rhinitis: Secondary | ICD-10-CM | POA: Diagnosis not present

## 2015-03-18 DIAGNOSIS — J3089 Other allergic rhinitis: Secondary | ICD-10-CM | POA: Diagnosis not present

## 2015-03-24 DIAGNOSIS — J4530 Mild persistent asthma, uncomplicated: Secondary | ICD-10-CM | POA: Diagnosis not present

## 2015-03-24 DIAGNOSIS — J3089 Other allergic rhinitis: Secondary | ICD-10-CM | POA: Diagnosis not present

## 2015-03-31 DIAGNOSIS — J3089 Other allergic rhinitis: Secondary | ICD-10-CM | POA: Diagnosis not present

## 2015-04-07 DIAGNOSIS — J3089 Other allergic rhinitis: Secondary | ICD-10-CM | POA: Diagnosis not present

## 2015-04-17 DIAGNOSIS — J3089 Other allergic rhinitis: Secondary | ICD-10-CM | POA: Diagnosis not present

## 2015-04-23 DIAGNOSIS — J3089 Other allergic rhinitis: Secondary | ICD-10-CM | POA: Diagnosis not present

## 2015-04-29 DIAGNOSIS — J3089 Other allergic rhinitis: Secondary | ICD-10-CM | POA: Diagnosis not present

## 2015-04-30 DIAGNOSIS — J3089 Other allergic rhinitis: Secondary | ICD-10-CM | POA: Diagnosis not present

## 2015-05-05 DIAGNOSIS — D692 Other nonthrombocytopenic purpura: Secondary | ICD-10-CM | POA: Diagnosis not present

## 2015-05-05 DIAGNOSIS — D239 Other benign neoplasm of skin, unspecified: Secondary | ICD-10-CM | POA: Diagnosis not present

## 2015-05-05 DIAGNOSIS — B354 Tinea corporis: Secondary | ICD-10-CM | POA: Diagnosis not present

## 2015-05-05 DIAGNOSIS — B359 Dermatophytosis, unspecified: Secondary | ICD-10-CM | POA: Diagnosis not present

## 2015-05-08 DIAGNOSIS — J3089 Other allergic rhinitis: Secondary | ICD-10-CM | POA: Diagnosis not present

## 2015-05-12 DIAGNOSIS — J3089 Other allergic rhinitis: Secondary | ICD-10-CM | POA: Diagnosis not present

## 2015-05-15 DIAGNOSIS — J3089 Other allergic rhinitis: Secondary | ICD-10-CM | POA: Diagnosis not present

## 2015-05-16 DIAGNOSIS — E119 Type 2 diabetes mellitus without complications: Secondary | ICD-10-CM | POA: Diagnosis not present

## 2015-05-16 DIAGNOSIS — H2513 Age-related nuclear cataract, bilateral: Secondary | ICD-10-CM | POA: Diagnosis not present

## 2015-05-19 DIAGNOSIS — J3089 Other allergic rhinitis: Secondary | ICD-10-CM | POA: Diagnosis not present

## 2015-05-22 DIAGNOSIS — J3089 Other allergic rhinitis: Secondary | ICD-10-CM | POA: Diagnosis not present

## 2015-05-26 DIAGNOSIS — J3089 Other allergic rhinitis: Secondary | ICD-10-CM | POA: Diagnosis not present

## 2015-06-03 DIAGNOSIS — J301 Allergic rhinitis due to pollen: Secondary | ICD-10-CM | POA: Diagnosis not present

## 2015-06-03 DIAGNOSIS — J3089 Other allergic rhinitis: Secondary | ICD-10-CM | POA: Diagnosis not present

## 2015-06-04 DIAGNOSIS — N401 Enlarged prostate with lower urinary tract symptoms: Secondary | ICD-10-CM | POA: Diagnosis not present

## 2015-06-04 DIAGNOSIS — N138 Other obstructive and reflux uropathy: Secondary | ICD-10-CM | POA: Diagnosis not present

## 2015-06-04 DIAGNOSIS — N390 Urinary tract infection, site not specified: Secondary | ICD-10-CM | POA: Diagnosis not present

## 2015-06-04 DIAGNOSIS — N323 Diverticulum of bladder: Secondary | ICD-10-CM | POA: Diagnosis not present

## 2015-06-04 DIAGNOSIS — R339 Retention of urine, unspecified: Secondary | ICD-10-CM | POA: Diagnosis not present

## 2015-06-04 DIAGNOSIS — R972 Elevated prostate specific antigen [PSA]: Secondary | ICD-10-CM | POA: Diagnosis not present

## 2015-06-04 DIAGNOSIS — R3912 Poor urinary stream: Secondary | ICD-10-CM | POA: Diagnosis not present

## 2015-06-16 DIAGNOSIS — J3089 Other allergic rhinitis: Secondary | ICD-10-CM | POA: Diagnosis not present

## 2015-06-24 DIAGNOSIS — J3089 Other allergic rhinitis: Secondary | ICD-10-CM | POA: Diagnosis not present

## 2015-06-25 DIAGNOSIS — R339 Retention of urine, unspecified: Secondary | ICD-10-CM | POA: Diagnosis not present

## 2015-06-25 DIAGNOSIS — R3912 Poor urinary stream: Secondary | ICD-10-CM | POA: Diagnosis not present

## 2015-07-01 DIAGNOSIS — J3089 Other allergic rhinitis: Secondary | ICD-10-CM | POA: Diagnosis not present

## 2015-07-19 IMAGING — CT CT CHEST W/O CM
2 of 4 series · 15 of 36 positions shown, 18 images · non-contrast
Comparison: DG CHEST 2 VIEW dated 01/07/2014; CT ABD-PEL WO/W CM
dated 05/16/2013; DG CHEST 2 VIEW dated 09/21/2012

CLINICAL DATA: Abnormal chest x-ray. Congestion with history of
asthma. No history of malignancy.

EXAM:
CT CHEST WITHOUT CONTRAST
TECHNIQUE: Multidetector CT imaging of the chest was performed following the
standard protocol without IV contrast.

[Series 2: chest w/o · axial · non-contrast · 0.70mm/px · z∈[-210,+30]mm · 12 of 58 slices shown, 15 images]
[im 5/58  mediastinal]
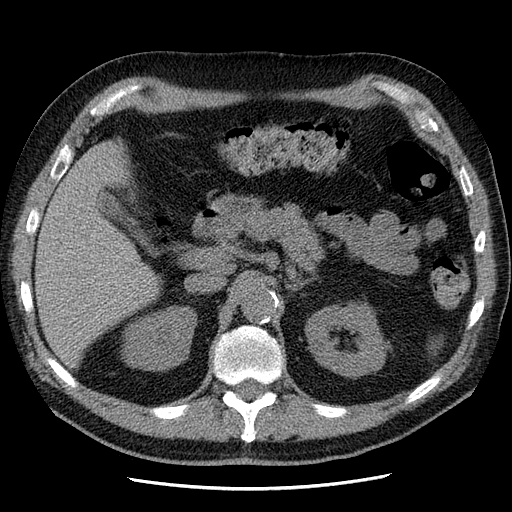
[im 5/58  lung]
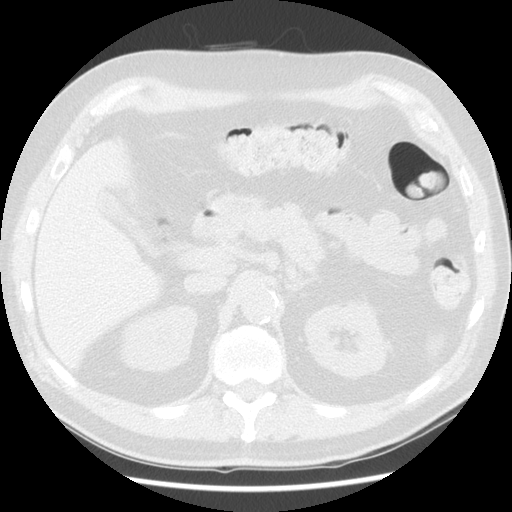
[im 9/58  lung]
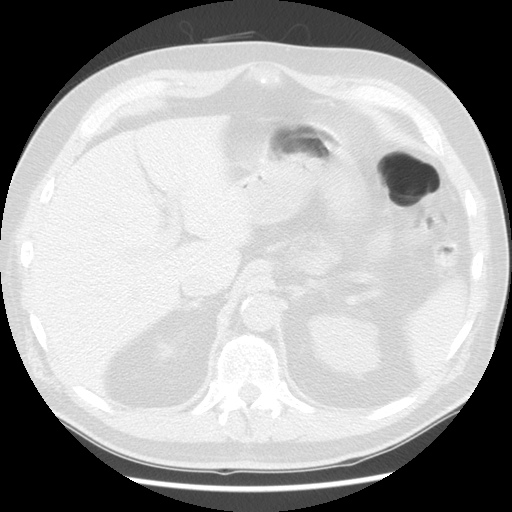
[im 14/58  lung]
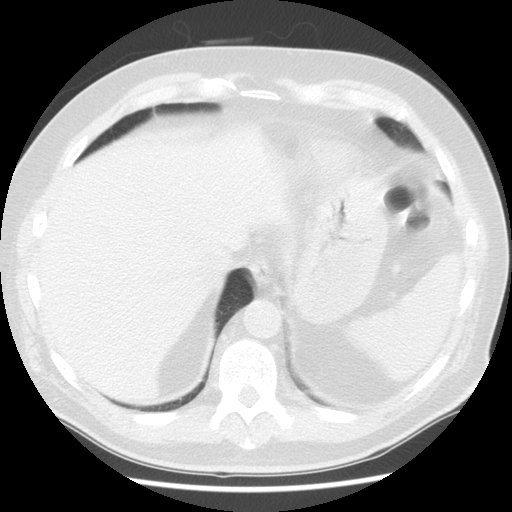
[im 18/58  lung]
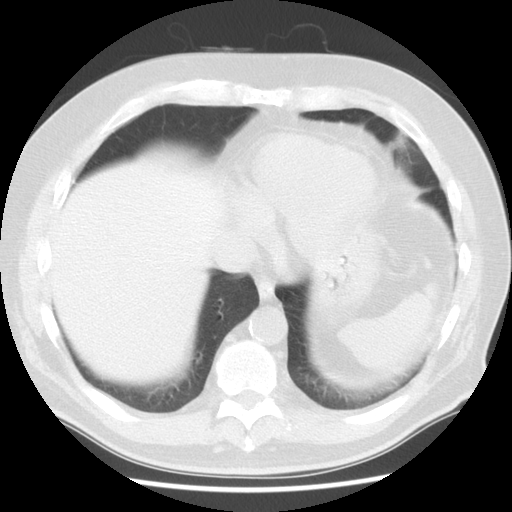
[im 22/58  mediastinal]
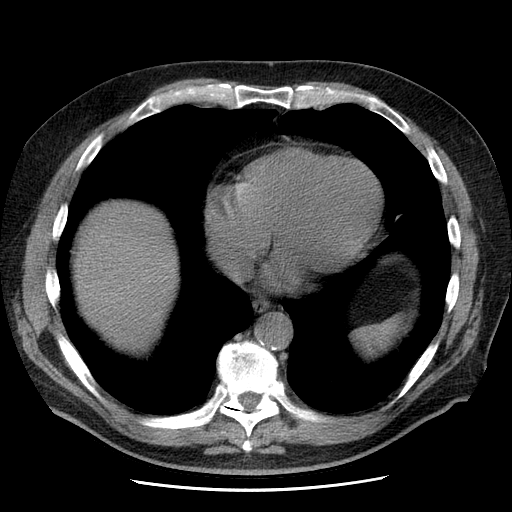
[im 22/58  lung]
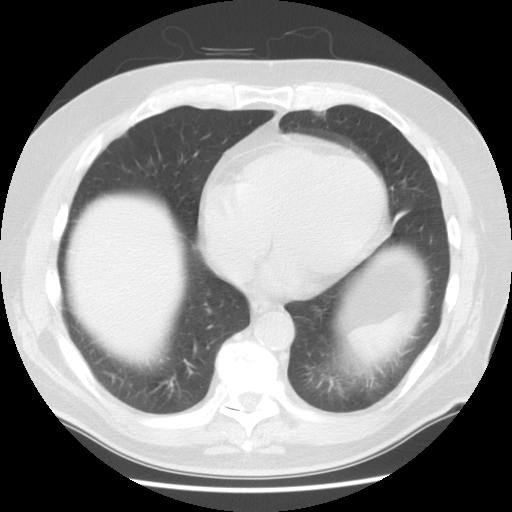
[im 27/58  lung]
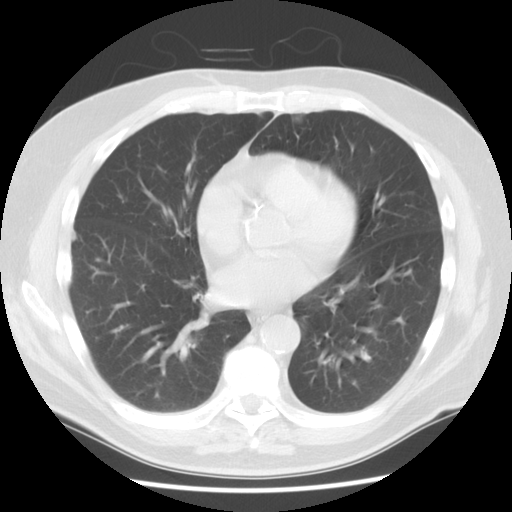
[im 31/58  lung]
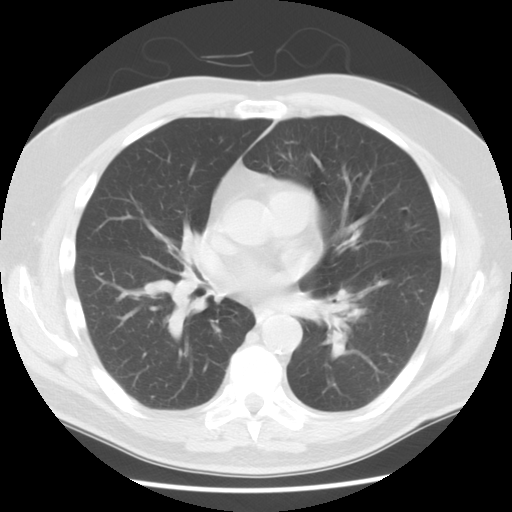
[im 36/58  lung]
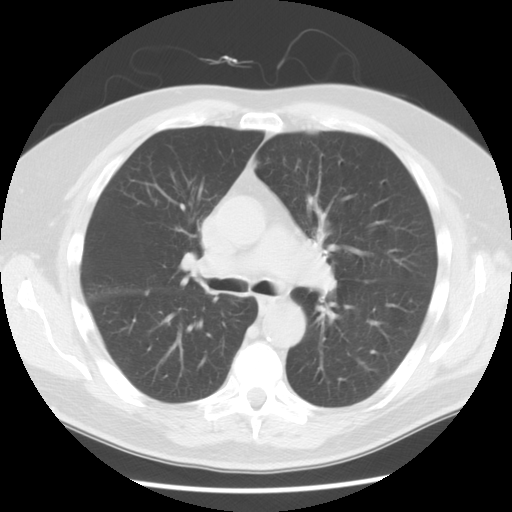
[im 40/58  mediastinal]
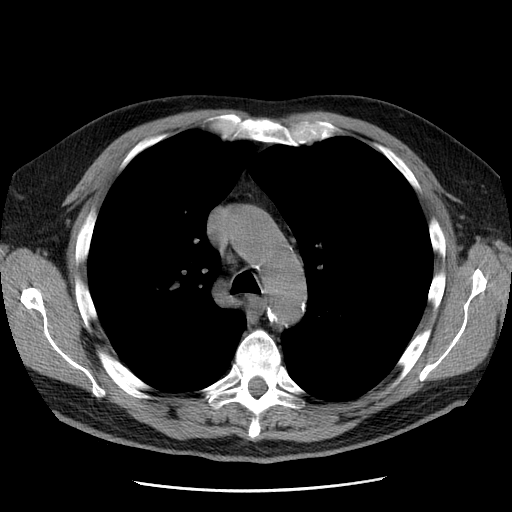
[im 40/58  lung]
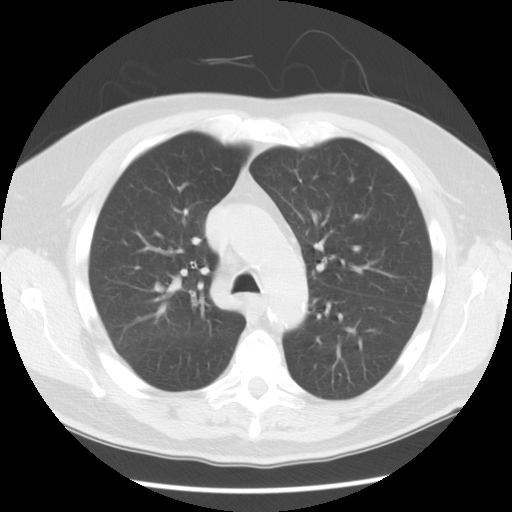
[im 44/58  lung]
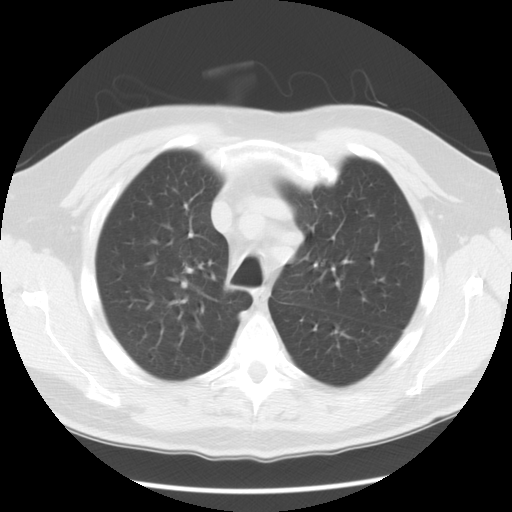
[im 49/58  lung]
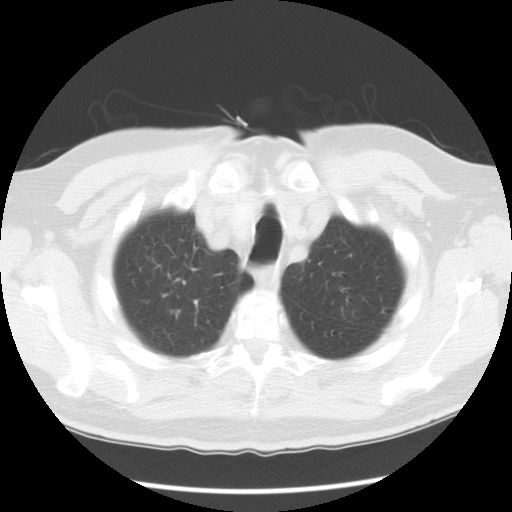
[im 53/58  lung]
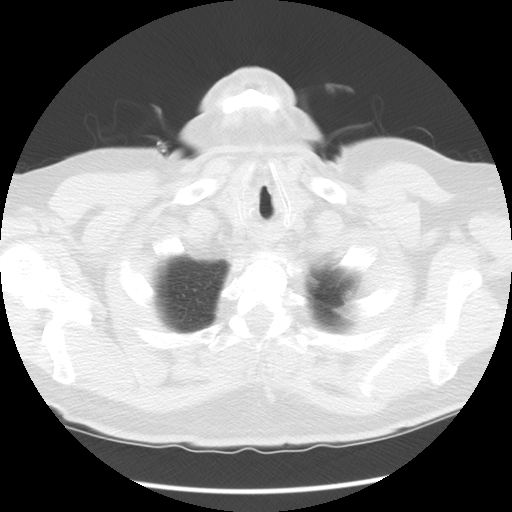

[Series 401: cor · coronal · 0.70mm/px · 3 of 126 slices shown]
[im 26/126  lung]
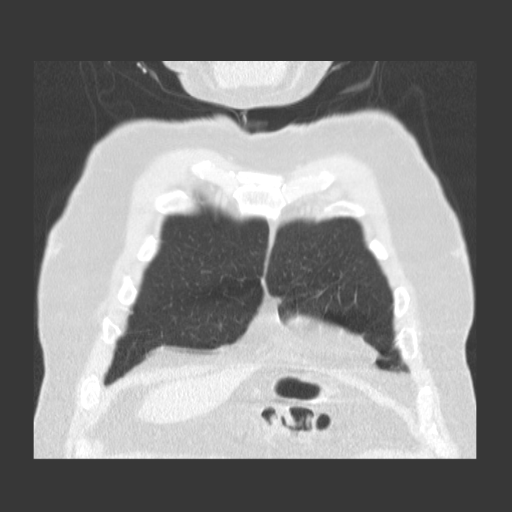
[im 51/126  lung]
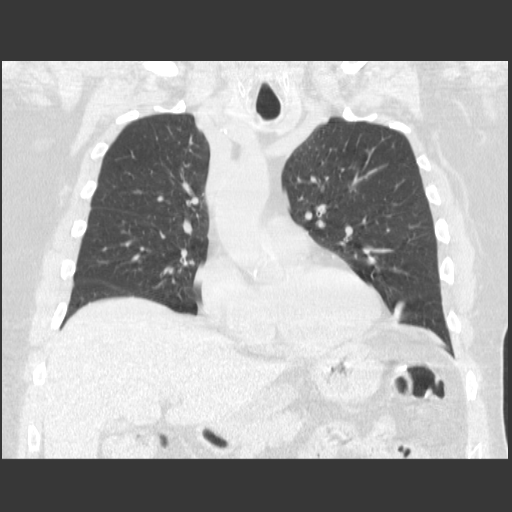
[im 76/126  lung]
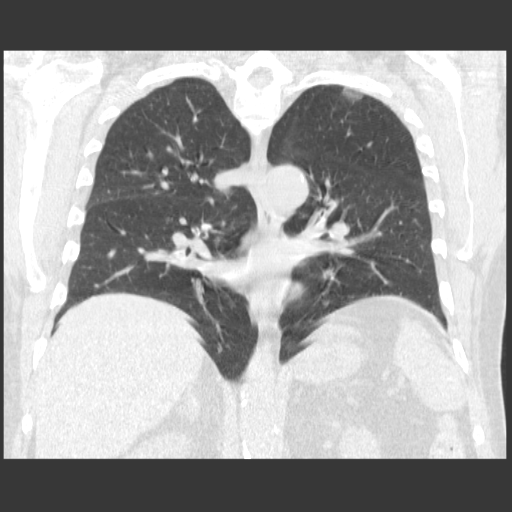

[15 of 36 positions shown; findings below may reference images not displayed]

FINDINGS: There are no enlarged mediastinal, hilar or axillary lymph nodes.
There is scattered atherosclerosis of the aorta, great vessels and
coronary arteries. The thoracic inlet, thyroid gland and
tracheobronchial tree appear unremarkable.

There are no suspicious pulmonary nodules. Specifically, there is no
right apical abnormality to correspond with the questioned finding
on prior radiographs. There is mild subpleural scarring at the left
apex. There is also mild scarring within the lingula.

There is no pleural or pericardial effusion. The visualized upper
abdomen is stable with grossly stable left renal cysts.
IMPRESSION: 1. No evidence of right apical pulmonary nodule.
2. No acute chest findings.
3. Scattered mild scarring in the left lung.
4. Atherosclerosis as described.

## 2015-07-24 DIAGNOSIS — J3089 Other allergic rhinitis: Secondary | ICD-10-CM | POA: Diagnosis not present

## 2015-07-28 DIAGNOSIS — J3089 Other allergic rhinitis: Secondary | ICD-10-CM | POA: Diagnosis not present

## 2015-07-28 DIAGNOSIS — Z23 Encounter for immunization: Secondary | ICD-10-CM | POA: Diagnosis not present

## 2015-07-29 DIAGNOSIS — H6123 Impacted cerumen, bilateral: Secondary | ICD-10-CM | POA: Diagnosis not present

## 2015-07-31 DIAGNOSIS — R339 Retention of urine, unspecified: Secondary | ICD-10-CM | POA: Diagnosis not present

## 2015-07-31 DIAGNOSIS — N323 Diverticulum of bladder: Secondary | ICD-10-CM | POA: Diagnosis not present

## 2015-07-31 DIAGNOSIS — N138 Other obstructive and reflux uropathy: Secondary | ICD-10-CM | POA: Diagnosis not present

## 2015-07-31 DIAGNOSIS — N401 Enlarged prostate with lower urinary tract symptoms: Secondary | ICD-10-CM | POA: Diagnosis not present

## 2015-08-04 DIAGNOSIS — J3089 Other allergic rhinitis: Secondary | ICD-10-CM | POA: Diagnosis not present

## 2015-08-14 DIAGNOSIS — J3089 Other allergic rhinitis: Secondary | ICD-10-CM | POA: Diagnosis not present

## 2015-08-19 DIAGNOSIS — J3089 Other allergic rhinitis: Secondary | ICD-10-CM | POA: Diagnosis not present

## 2015-08-25 DIAGNOSIS — J3089 Other allergic rhinitis: Secondary | ICD-10-CM | POA: Diagnosis not present

## 2015-09-01 DIAGNOSIS — J3089 Other allergic rhinitis: Secondary | ICD-10-CM | POA: Diagnosis not present

## 2015-09-17 DIAGNOSIS — J3089 Other allergic rhinitis: Secondary | ICD-10-CM | POA: Diagnosis not present

## 2015-09-22 DIAGNOSIS — J3089 Other allergic rhinitis: Secondary | ICD-10-CM | POA: Diagnosis not present

## 2015-09-29 DIAGNOSIS — J3089 Other allergic rhinitis: Secondary | ICD-10-CM | POA: Diagnosis not present

## 2015-09-30 DIAGNOSIS — J3089 Other allergic rhinitis: Secondary | ICD-10-CM | POA: Diagnosis not present

## 2015-10-24 DIAGNOSIS — J3089 Other allergic rhinitis: Secondary | ICD-10-CM | POA: Diagnosis not present

## 2015-10-29 DIAGNOSIS — J3089 Other allergic rhinitis: Secondary | ICD-10-CM | POA: Diagnosis not present

## 2015-11-04 DIAGNOSIS — J3089 Other allergic rhinitis: Secondary | ICD-10-CM | POA: Diagnosis not present

## 2015-11-05 DIAGNOSIS — N401 Enlarged prostate with lower urinary tract symptoms: Secondary | ICD-10-CM | POA: Diagnosis not present

## 2015-11-05 DIAGNOSIS — N138 Other obstructive and reflux uropathy: Secondary | ICD-10-CM | POA: Diagnosis not present

## 2015-11-05 DIAGNOSIS — R338 Other retention of urine: Secondary | ICD-10-CM | POA: Diagnosis not present

## 2015-11-05 DIAGNOSIS — N323 Diverticulum of bladder: Secondary | ICD-10-CM | POA: Diagnosis not present

## 2015-11-11 DIAGNOSIS — J301 Allergic rhinitis due to pollen: Secondary | ICD-10-CM | POA: Diagnosis not present

## 2015-11-12 ENCOUNTER — Other Ambulatory Visit: Payer: Self-pay | Admitting: Urology

## 2015-11-12 DIAGNOSIS — I10 Essential (primary) hypertension: Secondary | ICD-10-CM | POA: Diagnosis not present

## 2015-11-12 DIAGNOSIS — Z1389 Encounter for screening for other disorder: Secondary | ICD-10-CM | POA: Diagnosis not present

## 2015-11-12 DIAGNOSIS — E7800 Pure hypercholesterolemia, unspecified: Secondary | ICD-10-CM | POA: Diagnosis not present

## 2015-11-12 DIAGNOSIS — Z125 Encounter for screening for malignant neoplasm of prostate: Secondary | ICD-10-CM | POA: Diagnosis not present

## 2015-11-12 DIAGNOSIS — N390 Urinary tract infection, site not specified: Secondary | ICD-10-CM | POA: Diagnosis not present

## 2015-11-12 DIAGNOSIS — E089 Diabetes mellitus due to underlying condition without complications: Secondary | ICD-10-CM | POA: Diagnosis not present

## 2015-11-12 DIAGNOSIS — E119 Type 2 diabetes mellitus without complications: Secondary | ICD-10-CM | POA: Diagnosis not present

## 2015-11-14 DIAGNOSIS — J3089 Other allergic rhinitis: Secondary | ICD-10-CM | POA: Diagnosis not present

## 2015-11-18 DIAGNOSIS — J3089 Other allergic rhinitis: Secondary | ICD-10-CM | POA: Diagnosis not present

## 2015-11-20 DIAGNOSIS — J3089 Other allergic rhinitis: Secondary | ICD-10-CM | POA: Diagnosis not present

## 2015-11-24 DIAGNOSIS — J301 Allergic rhinitis due to pollen: Secondary | ICD-10-CM | POA: Diagnosis not present

## 2015-11-24 DIAGNOSIS — J3089 Other allergic rhinitis: Secondary | ICD-10-CM | POA: Diagnosis not present

## 2015-12-01 DIAGNOSIS — J3089 Other allergic rhinitis: Secondary | ICD-10-CM | POA: Diagnosis not present

## 2015-12-08 DIAGNOSIS — J3089 Other allergic rhinitis: Secondary | ICD-10-CM | POA: Diagnosis not present

## 2015-12-09 ENCOUNTER — Encounter (HOSPITAL_BASED_OUTPATIENT_CLINIC_OR_DEPARTMENT_OTHER): Payer: Self-pay | Admitting: *Deleted

## 2015-12-18 DIAGNOSIS — J3089 Other allergic rhinitis: Secondary | ICD-10-CM | POA: Diagnosis not present

## 2015-12-22 DIAGNOSIS — J3089 Other allergic rhinitis: Secondary | ICD-10-CM | POA: Diagnosis not present

## 2015-12-26 DIAGNOSIS — R350 Frequency of micturition: Secondary | ICD-10-CM | POA: Diagnosis not present

## 2015-12-26 DIAGNOSIS — Z01818 Encounter for other preprocedural examination: Secondary | ICD-10-CM | POA: Diagnosis not present

## 2015-12-26 DIAGNOSIS — Z0000 Encounter for general adult medical examination without abnormal findings: Secondary | ICD-10-CM | POA: Diagnosis not present

## 2015-12-26 DIAGNOSIS — N401 Enlarged prostate with lower urinary tract symptoms: Secondary | ICD-10-CM | POA: Diagnosis not present

## 2015-12-29 ENCOUNTER — Encounter (HOSPITAL_BASED_OUTPATIENT_CLINIC_OR_DEPARTMENT_OTHER): Payer: Self-pay | Admitting: *Deleted

## 2015-12-29 DIAGNOSIS — J3089 Other allergic rhinitis: Secondary | ICD-10-CM | POA: Diagnosis not present

## 2015-12-29 NOTE — Progress Notes (Signed)
NPO AFTER MN.  ARRIVE AT 0745.  NEEDS ISTAT AND EKG.  WILL DO SYMBICORT INHALER AM DOS W/ SIPS OF WATER.

## 2016-01-01 NOTE — H&P (Signed)
Reason For Visit Seen today for a preop visit.   Active Problems Problems  1. Benign prostatic hyperplasia with urinary obstruction (N40.1,N13.8)   Assessed By: Jimmey Ralph (Urology); Last Assessed: 25 Dec 2015 2. Preop examination [id number])   Assessed By: Jimmey Ralph (Urology); Last Assessed: 25 Dec 2015  History of Present Illness 75 YO male patient of Dr. Lyndal Rainbow seen today for a preop visit. Scheduled for TUR-P 01/02/16.    GU hx:         F/u - PCP Dr. Maudie Mercury     1-BPH with lower urinary tract symptoms and inc bladder emptying, left bladder diverticulum. Patient on [place]. On Avodart initially - prostate 127 g on u/s in 2010.   -Aug 2016 - pvr 916 ml. Stable when compared to prior CT. C/o weak stream.   -September 2016 urodynamics-hyposensitive bladder with large capacity (1675 mL), voided with a good contraction but low flow. Diverticulum filled preferentially. Patient strained to assist voiding but bladder pressures around 90 cm of water.    2- elevated PSA - associated with BPH. Prior biopsy negative.   PSA and biopsy history:  -2010 PSA 4.21), prostate biopsy benign, 127 g prostate -- started 5 alpha reductase inhibitor  -December 2011 PSA 3.27   -Jan 2013 PSA 3.00   -May 2014 labs: BUN 19, creatinine 1.2, normal LFTs and alkaline phosphatase   -Jul 2014 PSA 2.03, bun 16, cr 1.29, nl DRE  -June 2015 PSA 2.57, BUN 17, creatinine 1.3, GFR 57-stable-Dr. Maudie Mercury labs   -Aug 2015 nl DRE, stable on Jalyn   -Aug 2016 PSA 2.12, nl DRE    3-Gross hematuria -   -Dec 2011 CT and cystoscopy for terminal hematuria. On cystoscopy he was found to bleed easily from the bladder neck, bladder diverticulum - no definite lesion.   -Jan 2012 OR -- cystoscopy of the diverticular opening high up left on the bladder wall. A guidewire/ureteroscope was used to visualize the diverticulum. The exam was reassuring but visualization would've been ranked an 8/10. Saline washings  showed reactive but no malignant cells.   -Apr 2012 urine cytology was negative for malignant cells   -Jul 2014 [place] on U/A - CT large left bladder tic, thick walled bladder, no neoplasm; Cystoscopy-benign, could not find diverticulum, large prostate    4-Impotence - Viagra is not working as well and he was given Levitra 20 mg June 2012.     March 2017:     Today denies any complaints.   Surgical History Problems  1. History of Cystoscopy With Fulguration 2. History of Ear Surgery  Current Meds 1. Allergy Injection;  Therapy: (Recorded:10Mar2017) to Recorded 2. Allergy TABS;  Therapy: (Recorded:10Mar2017) to Recorded 3. AmLODIPine Besylate 2.5 MG Oral Tablet;  Therapy: 02Apr2012 to Recorded 4. Aspirin 81 MG TABS;  Therapy: (Recorded:11Mar2010) to Recorded 5. Cetirizine HCl - 10 MG Oral Tablet;  Therapy: (Recorded:13Oct2016) to Recorded 6. Cinnamon CAPS;  Therapy: (Recorded:24Jul2014) to Recorded 7. EpiPen 2-Pak 0.3 MG/0.3ML DEVI;  Therapy: 02Sep2011 to Recorded 8. Feosol TABS;  Therapy: (Recorded:24Jul2014) to Recorded 9. Jalyn 0.5-0.4 MG Oral Capsule;  Therapy: (Recorded:13Oct2016) to Recorded 10. MetFORMIN HCl - 500 MG Oral Tablet;   Therapy: (Recorded:21Dec2011) to Recorded 11. One Daily Complete TABS;   Therapy: (Recorded:11Mar2010) to Recorded 12. Simvastatin 40 MG Oral Tablet;   Therapy: (Recorded:24Jul2014) to Recorded 13. Symbicort 160-4.5 MCG/ACT Inhalation Aerosol;   Therapy: (Recorded:11Mar2010) to Recorded 14. Valsartan 320 MG Oral Tablet;   Therapy: (Recorded:10Mar2017) to Recorded  Allergies Medication  1. Zithromax PACK  Family History Problems  1. Family history of Death In The Family Father   father died age 85-heart 16. Family history of Death In The Family Mother   mother died aage 46 heart  Social History Problems  1. Denied: History of Alcohol Use (History) 2. Denied: History of Caffeine Use 3. Marital History - Currently  Married 4. Never A Smoker 5. Occupation:   retired 88. Denied: History of Tobacco Use  Review of Systems Genitourinary, constitutional, skin, eye, otolaryngeal, hematologic/lymphatic, cardiovascular, pulmonary, endocrine, musculoskeletal, gastrointestinal, neurological and psychiatric system(s) were reviewed and pertinent findings if present are noted and are otherwise negative.    Vitals Vital Signs [Data Includes: Last 1 Day]  Recorded: [id number] 09:05AM  Weight: 181 lb  BMI Calculated: 28.35 BSA Calculated: 1.94 Blood Pressure: 132 / 74 Temperature: 97.4 F Heart Rate: 65  Physical Exam Constitutional: Well nourished and well developed . No acute distress. The patient appears well hydrated.  ENT:. The ears and nose are normal in appearance.  Neck: The appearance of the neck is normal.  Pulmonary: No respiratory distress.  Cardiovascular: Heart rate and rhythm are normal.  Abdomen: The abdomen is flat. The abdomen is soft and nontender.  Skin: Normal skin turgor and normal skin color and pigmentation.  Neuro/Psych:. Mood and affect are appropriate.    Results/Data  The following clinical lab reports were reviewed:  UA: appears infected will culture. Selected Results  URINE CULTURE [id number] 09:11AM Rosalyn Gess, Diane  SOURCE : CLEAN CATCH SPECIMEN TYPE: URINE   Test Name Result Flag Reference  CULTURE, URINE Culture, Urine    ===== COLONY COUNT: =====  NO GROWTH   FINAL REPORT: NO GROWTH   UA With REFLEX [id number] 08:54AM Rosalyn Gess, Diane  SPECIMEN TYPE: CLEAN CATCH   Test Name Result Flag Reference  COLOR AMBER A YELLOW  ** PLEASE NOTE CHANGE IN UNIT OF MEASURE AND REFERENCE RANGE(S). **   BIOCHEMICALS MAY BE AFFECTED BY THE COLOR OF THE URINE.  APPEARANCE CLOUDY A CLEAR  SPECIFIC GRAVITY 1.020  1.001-1.035  pH 6.0  5.0-8.0  GLUCOSE NEGATIVE  NEGATIVE  BILIRUBIN NEGATIVE  NEGATIVE  KETONE NEGATIVE  NEGATIVE  BLOOD 3+ A NEGATIVE  PROTEIN 1+ A NEGATIVE  NITRITE  NEGATIVE  NEGATIVE  LEUKOCYTE ESTERASE 1+ A NEGATIVE  SQUAMOUS EPITHELIAL/HPF NONE SEEN HPF  <=5  WBC 20-40 WBC/HPF A <=5  RBC >60 RBC/HPF A <=2  BACTERIA FEW HPF A NONE SEEN  CRYSTALS NONE SEEN HPF  NONE SEEN  CASTS NONE SEEN LPF  NONE SEEN  Yeast NONE SEEN HPF  NONE SEEN   Assessment Assessed  1. Benign prostatic hyperplasia with urinary obstruction (N40.1,N13.8) 2. Preop examination (Z01.818)  Plan Benign prostatic hyperplasia with urinary obstruction  1. Follow-up Lab Only Office  Follow-up  Status: Canceled - Date of Service Health Maintenance  2. UA With REFLEX; [Do Not Release]; Status:Complete;   [id number] 08:54AM  Culture urine. No ABX unless culture proven UTI  Pt cleared to proceed with upcoming surgical procedure   Signatures Electronically signed by : Jimmey Ralph, Trey Paula; Dec 30 2015  7:40AM EST  Add: urine cx negative

## 2016-01-02 ENCOUNTER — Other Ambulatory Visit: Payer: Self-pay

## 2016-01-02 ENCOUNTER — Emergency Department (HOSPITAL_COMMUNITY)
Admission: EM | Admit: 2016-01-02 | Discharge: 2016-01-02 | Disposition: A | Discharge status: Home / Self Care | Payer: Medicare Other | Attending: Emergency Medicine | Admitting: Emergency Medicine

## 2016-01-02 ENCOUNTER — Ambulatory Visit (HOSPITAL_BASED_OUTPATIENT_CLINIC_OR_DEPARTMENT_OTHER)
Admission: RE | Admit: 2016-01-02 | Discharge: 2016-01-02 | Disposition: A | Discharge status: Home / Self Care | Payer: Medicare Other | Source: Ambulatory Visit | Attending: Urology | Admitting: Urology

## 2016-01-02 ENCOUNTER — Ambulatory Visit (HOSPITAL_BASED_OUTPATIENT_CLINIC_OR_DEPARTMENT_OTHER): Payer: Medicare Other | Admitting: Anesthesiology

## 2016-01-02 ENCOUNTER — Encounter (HOSPITAL_BASED_OUTPATIENT_CLINIC_OR_DEPARTMENT_OTHER): Payer: Self-pay | Admitting: *Deleted

## 2016-01-02 ENCOUNTER — Encounter (HOSPITAL_BASED_OUTPATIENT_CLINIC_OR_DEPARTMENT_OTHER)
Admission: RE | Disposition: A | Discharge status: Home / Self Care | Payer: Self-pay | Source: Ambulatory Visit | Attending: Urology

## 2016-01-02 ENCOUNTER — Encounter (HOSPITAL_COMMUNITY): Payer: Self-pay | Admitting: Emergency Medicine

## 2016-01-02 DIAGNOSIS — Z7982 Long term (current) use of aspirin: Secondary | ICD-10-CM | POA: Insufficient documentation

## 2016-01-02 DIAGNOSIS — Z79899 Other long term (current) drug therapy: Secondary | ICD-10-CM | POA: Diagnosis not present

## 2016-01-02 DIAGNOSIS — E119 Type 2 diabetes mellitus without complications: Secondary | ICD-10-CM | POA: Insufficient documentation

## 2016-01-02 DIAGNOSIS — T83038A Leakage of other indwelling urethral catheter, initial encounter: Secondary | ICD-10-CM | POA: Insufficient documentation

## 2016-01-02 DIAGNOSIS — Z7984 Long term (current) use of oral hypoglycemic drugs: Secondary | ICD-10-CM | POA: Insufficient documentation

## 2016-01-02 DIAGNOSIS — N320 Bladder-neck obstruction: Secondary | ICD-10-CM | POA: Diagnosis not present

## 2016-01-02 DIAGNOSIS — Z7951 Long term (current) use of inhaled steroids: Secondary | ICD-10-CM | POA: Diagnosis not present

## 2016-01-02 DIAGNOSIS — N138 Other obstructive and reflux uropathy: Secondary | ICD-10-CM | POA: Diagnosis not present

## 2016-01-02 DIAGNOSIS — Y658 Other specified misadventures during surgical and medical care: Secondary | ICD-10-CM | POA: Insufficient documentation

## 2016-01-02 DIAGNOSIS — R338 Other retention of urine: Secondary | ICD-10-CM | POA: Diagnosis not present

## 2016-01-02 DIAGNOSIS — N401 Enlarged prostate with lower urinary tract symptoms: Secondary | ICD-10-CM | POA: Diagnosis not present

## 2016-01-02 DIAGNOSIS — N323 Diverticulum of bladder: Secondary | ICD-10-CM | POA: Insufficient documentation

## 2016-01-02 DIAGNOSIS — I10 Essential (primary) hypertension: Secondary | ICD-10-CM | POA: Diagnosis not present

## 2016-01-02 DIAGNOSIS — N400 Enlarged prostate without lower urinary tract symptoms: Secondary | ICD-10-CM | POA: Diagnosis not present

## 2016-01-02 DIAGNOSIS — J45909 Unspecified asthma, uncomplicated: Secondary | ICD-10-CM | POA: Insufficient documentation

## 2016-01-02 HISTORY — DX: Frequency of micturition: R35.0

## 2016-01-02 HISTORY — DX: Allergic rhinitis, unspecified: J30.9

## 2016-01-02 HISTORY — DX: Bladder-neck obstruction: N32.0

## 2016-01-02 HISTORY — DX: Type 2 diabetes mellitus without complications: E11.9

## 2016-01-02 HISTORY — PX: TRANSURETHRAL RESECTION OF PROSTATE: SHX73

## 2016-01-02 HISTORY — DX: Presence of spectacles and contact lenses: Z97.3

## 2016-01-02 HISTORY — DX: Benign prostatic hyperplasia without lower urinary tract symptoms: N40.0

## 2016-01-02 HISTORY — DX: Personal history of transient ischemic attack (TIA), and cerebral infarction without residual deficits: Z86.73

## 2016-01-02 HISTORY — DX: Presence of external hearing-aid: Z97.4

## 2016-01-02 LAB — POCT I-STAT 4, (NA,K, GLUC, HGB,HCT)
Glucose, Bld: 127 mg/dL — ABNORMAL HIGH (ref 65–99)
HEMATOCRIT: 44 % (ref 39.0–52.0)
Hemoglobin: 15 g/dL (ref 13.0–17.0)
Potassium: 4.1 mmol/L (ref 3.5–5.1)
SODIUM: 141 mmol/L (ref 135–145)

## 2016-01-02 LAB — GLUCOSE, CAPILLARY: GLUCOSE-CAPILLARY: 146 mg/dL — AB (ref 65–99)

## 2016-01-02 SURGERY — TURP (TRANSURETHRAL RESECTION OF PROSTATE)
Anesthesia: General | Site: Prostate

## 2016-01-02 MED ORDER — OXYCODONE HCL 5 MG/5ML PO SOLN
5.0000 mg | Freq: Once | ORAL | Status: DC | PRN
  Filled 2016-01-02: qty 5

## 2016-01-02 MED ORDER — ACETAMINOPHEN 160 MG/5ML PO SOLN
325.0000 mg | ORAL | Status: DC | PRN
  Filled 2016-01-02: qty 20.3

## 2016-01-02 MED ORDER — OXYCODONE HCL 5 MG PO TABS
5.0000 mg | ORAL_TABLET | Freq: Once | ORAL | Status: DC | PRN
  Filled 2016-01-02: qty 1

## 2016-01-02 MED ORDER — ACETAMINOPHEN 325 MG PO TABS
325.0000 mg | ORAL_TABLET | ORAL | Status: DC | PRN
  Filled 2016-01-02: qty 2

## 2016-01-02 MED ORDER — FENTANYL CITRATE (PF) 100 MCG/2ML IJ SOLN
INTRAMUSCULAR | Status: AC
  Filled 2016-01-02: qty 2

## 2016-01-02 MED ORDER — EPHEDRINE SULFATE 50 MG/ML IJ SOLN
INTRAMUSCULAR | Status: DC | PRN
  Administered 2016-01-02 (×5): 10 mg via INTRAVENOUS

## 2016-01-02 MED ORDER — NITROFURANTOIN MONOHYD MACRO 100 MG PO CAPS: 100.0000 mg | ORAL_CAPSULE | Freq: Every day | ORAL | Status: DC

## 2016-01-02 MED ORDER — DEXAMETHASONE SODIUM PHOSPHATE 10 MG/ML IJ SOLN
INTRAMUSCULAR | Status: AC
  Filled 2016-01-02: qty 1

## 2016-01-02 MED ORDER — SODIUM CHLORIDE 0.9 % IR SOLN
Status: DC | PRN
  Administered 2016-01-02 (×2): 3000 mL via INTRAVESICAL

## 2016-01-02 MED ORDER — EPHEDRINE SULFATE 50 MG/ML IJ SOLN
INTRAMUSCULAR | Status: AC
  Filled 2016-01-02: qty 1

## 2016-01-02 MED ORDER — TRAMADOL HCL 50 MG PO TABS: 50.0000 mg | ORAL_TABLET | Freq: Four times a day (QID) | ORAL | Status: DC | PRN

## 2016-01-02 MED ORDER — LIDOCAINE HCL (CARDIAC) 20 MG/ML IV SOLN
INTRAVENOUS | Status: AC
  Filled 2016-01-02: qty 5

## 2016-01-02 MED ORDER — CEFAZOLIN SODIUM-DEXTROSE 2-3 GM-% IV SOLR
INTRAVENOUS | Status: AC
  Filled 2016-01-02: qty 50

## 2016-01-02 MED ORDER — CEFAZOLIN SODIUM 1-5 GM-% IV SOLN
1.0000 g | INTRAVENOUS | Status: DC
  Filled 2016-01-02: qty 50

## 2016-01-02 MED ORDER — ONDANSETRON HCL 4 MG/2ML IJ SOLN
INTRAMUSCULAR | Status: DC | PRN
  Administered 2016-01-02: 4 mg via INTRAVENOUS

## 2016-01-02 MED ORDER — FENTANYL CITRATE (PF) 100 MCG/2ML IJ SOLN
INTRAMUSCULAR | Status: DC | PRN
  Administered 2016-01-02: 50 ug via INTRAVENOUS
  Administered 2016-01-02 (×2): 25 ug via INTRAVENOUS

## 2016-01-02 MED ORDER — ONDANSETRON HCL 4 MG/2ML IJ SOLN
INTRAMUSCULAR | Status: AC
  Filled 2016-01-02: qty 2

## 2016-01-02 MED ORDER — LACTATED RINGERS IV SOLN
INTRAVENOUS | Status: DC
  Administered 2016-01-02 (×2): via INTRAVENOUS
  Filled 2016-01-02: qty 1000

## 2016-01-02 MED ORDER — BELLADONNA ALKALOIDS-OPIUM 16.2-60 MG RE SUPP
RECTAL | Status: DC | PRN
  Administered 2016-01-02: 1 via RECTAL

## 2016-01-02 MED ORDER — CEFAZOLIN SODIUM-DEXTROSE 2-3 GM-% IV SOLR
2.0000 g | INTRAVENOUS | Status: AC
  Administered 2016-01-02: 2 g via INTRAVENOUS
  Filled 2016-01-02: qty 50

## 2016-01-02 MED ORDER — DEXAMETHASONE SODIUM PHOSPHATE 4 MG/ML IJ SOLN
INTRAMUSCULAR | Status: DC | PRN
  Administered 2016-01-02: 10 mg via INTRAVENOUS

## 2016-01-02 MED ORDER — PROPOFOL 10 MG/ML IV BOLUS
INTRAVENOUS | Status: DC | PRN
  Administered 2016-01-02: 20 mg via INTRAVENOUS
  Administered 2016-01-02: 150 mg via INTRAVENOUS

## 2016-01-02 MED ORDER — PROPOFOL 10 MG/ML IV BOLUS
INTRAVENOUS | Status: AC
  Filled 2016-01-02: qty 20

## 2016-01-02 MED ORDER — LIDOCAINE HCL (CARDIAC) 20 MG/ML IV SOLN
INTRAVENOUS | Status: DC | PRN
  Administered 2016-01-02: 80 mg via INTRAVENOUS

## 2016-01-02 MED ORDER — FENTANYL CITRATE (PF) 100 MCG/2ML IJ SOLN
25.0000 ug | INTRAMUSCULAR | Status: DC | PRN
  Filled 2016-01-02: qty 1

## 2016-01-02 MED ORDER — OXYBUTYNIN CHLORIDE 5 MG PO TABS: 5.0000 mg | ORAL_TABLET | Freq: Three times a day (TID) | ORAL | Status: DC | PRN

## 2016-01-02 SURGICAL SUPPLY — 28 items
ADAPTER IRRIG TUBE 2 SPIKE SOL (ADAPTER) ×6 IMPLANT
BAG DRAIN URO-CYSTO SKYTR STRL (DRAIN) ×3 IMPLANT
BAG URINE DRAINAGE (UROLOGICAL SUPPLIES) ×3 IMPLANT
CATH COUDE FOLEY 2W 5CC 18FR (CATHETERS) ×3 IMPLANT
CLOTH BEACON ORANGE TIMEOUT ST (SAFETY) ×3 IMPLANT
ELECT BIVAP BIPO 22/24 DONUT (ELECTROSURGICAL) ×3
ELECTRD BIVAP BIPO 22/24 DONUT (ELECTROSURGICAL) ×1 IMPLANT
EVACUATOR MICROVAS BLADDER (UROLOGICAL SUPPLIES) IMPLANT
GLOVE BIO SURGEON STRL SZ 6.5 (GLOVE) ×2 IMPLANT
GLOVE BIO SURGEON STRL SZ7.5 (GLOVE) ×6 IMPLANT
GLOVE BIO SURGEONS STRL SZ 6.5 (GLOVE) ×1
GLOVE BIOGEL PI IND STRL 6.5 (GLOVE) ×1 IMPLANT
GLOVE BIOGEL PI IND STRL 8 (GLOVE) ×1 IMPLANT
GLOVE BIOGEL PI INDICATOR 6.5 (GLOVE) ×2
GLOVE BIOGEL PI INDICATOR 8 (GLOVE) ×2
GOWN STRL REUS W/ TWL XL LVL3 (GOWN DISPOSABLE) ×2 IMPLANT
GOWN STRL REUS W/TWL XL LVL3 (GOWN DISPOSABLE) ×4
HOLDER FOLEY CATH W/STRAP (MISCELLANEOUS) ×3 IMPLANT
IV NS 1000ML (IV SOLUTION) ×40
IV NS 1000ML BAXH (IV SOLUTION) ×20 IMPLANT
IV NS IRRIG 3000ML ARTHROMATIC (IV SOLUTION) ×18 IMPLANT
KIT ROOM TURNOVER WOR (KITS) ×3 IMPLANT
MANIFOLD NEPTUNE II (INSTRUMENTS) ×3 IMPLANT
PACK CYSTO (CUSTOM PROCEDURE TRAY) ×3 IMPLANT
SET ASPIRATION TUBING (TUBING) ×3 IMPLANT
SYR 30ML LL (SYRINGE) ×3 IMPLANT
TUBE CONNECTING 12'X1/4 (SUCTIONS) ×1
TUBE CONNECTING 12X1/4 (SUCTIONS) ×2 IMPLANT

## 2016-01-02 NOTE — Anesthesia Procedure Notes (Signed)
Procedure Name: LMA Insertion Date/Time: 01/02/2016 9:16 AM Performed by: Mechele Claude Pre-anesthesia Checklist: Patient identified, Emergency Drugs available, Suction available and Patient being monitored Patient Re-evaluated:Patient Re-evaluated prior to inductionOxygen Delivery Method: Circle System Utilized Preoxygenation: Pre-oxygenation with 100% oxygen Intubation Type: IV induction Ventilation: Mask ventilation without difficulty LMA: LMA inserted LMA Size: 4.0 Number of attempts: 1 Airway Equipment and Method: bite block Placement Confirmation: positive ETCO2 Tube secured with: Tape Dental Injury: Teeth and Oropharynx as per pre-operative assessment

## 2016-01-02 NOTE — Anesthesia Postprocedure Evaluation (Signed)
Anesthesia Post Note  Patient: Adrian Bailey  Procedure(s) Performed: Procedure(s) (LRB): TRANSURETHRAL RESECTION OF THE PROSTATE (TURP) (N/A)  Patient location during evaluation: PACU Anesthesia Type: General Level of consciousness: awake Pain management: pain level controlled Vital Signs Assessment: post-procedure vital signs reviewed and stable Respiratory status: spontaneous breathing Cardiovascular status: stable Postop Assessment: no signs of nausea or vomiting Anesthetic complications: no    Last Vitals:  Filed Vitals:   01/02/16 1200 01/02/16 1238  BP: 118/60 122/57  Pulse: 58 55  Temp:  36.4 C  Resp: 12 14    Last Pain:  Filed Vitals:   01/02/16 1307  PainSc: Asleep                 Teddi Badalamenti

## 2016-01-02 NOTE — ED Notes (Signed)
Patient sent by Dr Gaynelle Arabian for replacement of catheter bag that is leaking. Patient has no complaints.

## 2016-01-02 NOTE — Transfer of Care (Signed)
Last Vitals:  Filed Vitals:   01/02/16 0757  BP: 144/69  Pulse: 61  Temp: 36.6 C  Resp: 16    Immediate Anesthesia Transfer of Care Note  Patient: Adrian Bailey  Procedure(s) Performed: Procedure(s) (LRB): TRANSURETHRAL RESECTION OF THE PROSTATE (TURP) (N/A)  Patient Location: PACU  Anesthesia Type: General  Level of Consciousness: awake, alert  and oriented  Airway & Oxygen Therapy: Patient Spontanous Breathing and Patient connected to nasal cannula oxygen  Post-op Assessment: Report given to PACU RN and Post -op Vital signs reviewed and stable  Post vital signs: Reviewed and stable  Complications: No apparent anesthesia complications

## 2016-01-02 NOTE — Op Note (Signed)
Preoperative diagnosis: BPH, urinary retention, bladder diverticulum Postoperative diagnosis: Same  Procedure: Bipolar TURP  Surgeon: Junious Silk  Anesthesia: Gen.  Indication for procedure: 75 year old with increasing post void residuals with obstructing median lobe of prostate. He was brought for TURP.  Findings: On exam under anesthesia the penis was uncircumcised. The foreskin was somewhat fibrotic and I had to reduce it to prevent paraphimosis. There were no masses or lesions. The penis was normal. The testicles were descended bilaterally and palpably normal. On digital rectal exam the prostate was enlarged but smooth without hard areas or nodules. All landmarks were preserved. A placed a B&O suppository.  On cystoscopy there was primarily a large median lobe. There were no stones or foreign bodies in the bladder. Patient had a very large capacity bladder which made it difficult to inspect every area of the dome anterior regions. After resecting the median lobe was able to find the ureteral orifices which interestingly were shifted toward the left. The left ureteral orifice up on the left wall the right ureteral orifice more in the midline. I do believe I was able to get in the diverticulum and the mucosa appeared normal. I noted no tumors. There were short lateral lobes which took a quick resection to open up the entire prostatic urethra after the median lobe was resected.  Description of procedure: After consent was obtained patient brought to the operating room. After adequate anesthesia he is placed in lithotomy position and prepped and draped in usual sterile fashion. A timeout was performed to confirm the patient and procedure. An exam under anesthesia was performed. The cystoscope was passed per urethra and the prostate and bladder inspected. Then passed the resectoscope with the large loop. Initially I could not identify the ureteral orifices so I came back into the prostatic urethra and  resected at 5:00 and 7:00 to create grooves between the median lobe and once I reached circular bladder neck fibers brought this incision down to the very bilaterally. I then resected the median lobe but left some of the very middle tissue protruding out into the bladder. Hemostasis was quite good. I was then able to identify the left ureteral orifice with was actually rotated and up on the left bladder wall. This is somewhat noted on the delayed on the CT. Having found one of the ureteral orifices I was able to complete the median lobe resection and bring this down toward the veru. All the chips were drained and inspecting I was able to find the right ureteral orifice. This created an excellent channel up from the inferior but now there were "curtains" of lateral lobe tissue. I then went anterior to posterior, bladder neck to veru and resected the right lateral lobe and the left lateral lobe. The lateral lobes did not require a lot of resection may be about a 10th of the resection time. The lateral lobe chips were drained. Hemostasis was excellent throughout the case and also excellent at low-pressure. Looking up on the left lateral wall believe I saw the diverticulum which was still full of fluid. This was drained and I believe I was able to see inside the diverticulum and the mucosa appeared normal without tumor. There were no stones in the diverticulum. Again the prostatic urethra and ureteral orifices were visualized and everything was noted to be normal. Having identified all the anatomy resected the remaining midline median lobe tissue that was protruding out into the bladder safely back to the bladder neck. These chips were drained and hemostasis  was excellent at low-pressure. The bladder was refilled and the scope removed. An 46 French coud catheter was placed. I put 17 mL in the balloon and seated at the bladder neck. Irrigation was clear. He was awakened and taken to the recovery room in stable  condition.   Complications: none  EBL: 20 ml   Specimen: TURP chips  Drains: 18Fr coude catheter

## 2016-01-02 NOTE — Interval H&P Note (Signed)
History and Physical Interval Note:  01/02/2016 9:16 AM  Adrian Bailey  has presented today for surgery, with the diagnosis of BENIGN PROSTATIC HYPERPLASIA WITH BLADDER OUTLET OBSTRUCTION  The various methods of treatment have been discussed with the patient and family. After consideration of risks, benefits and other options for treatment, the patient has consented to  Procedure(s): TRANSURETHRAL RESECTION OF THE PROSTATE (TURP) (N/A) as a surgical intervention .  The patient's history has been reviewed, patient examined, no change in status, stable for surgery.  I have reviewed the patient's chart and labs. I discussed with the patient and his wife the nature, potential benefits, risks and alternatives to TURP-staged, including side effects of the proposed treatment, the likelihood of the patient achieving the goals of the procedure, and any potential problems that might occur during the procedure or recuperation. All questions answered. Patient elects to proceed. Discussed foley post-op, possible CBI.    Questions were answered to the patient's satisfaction.     [name]

## 2016-01-02 NOTE — ED Notes (Signed)
Patient called to treatment room, no answer. Was seen leaving the department.

## 2016-01-02 NOTE — Anesthesia Preprocedure Evaluation (Signed)
Anesthesia Evaluation  Patient identified by MRN, date of birth, ID band Patient awake    Reviewed: Allergy & Precautions, NPO status , Patient's Chart, lab work & pertinent test results  History of Anesthesia Complications Negative for: history of anesthetic complications  Airway Mallampati: III  TM Distance: <3 FB Neck ROM: Full    Dental  (+) Teeth Intact   Pulmonary neg shortness of breath, asthma , neg sleep apnea, neg recent URI, neg PE   breath sounds clear to auscultation       Cardiovascular hypertension, Pt. on medications (-) angina(-) Past MI and (-) CHF (-) dysrhythmias  Rhythm:Regular     Neuro/Psych neg Seizures TIAnegative psych ROS   GI/Hepatic negative GI ROS, Neg liver ROS,   Endo/Other  diabetes, Type 2, Oral Hypoglycemic Agents  Renal/GU      Musculoskeletal negative musculoskeletal ROS (+)   Abdominal   Peds  Hematology negative hematology ROS (+)   Anesthesia Other Findings   Reproductive/Obstetrics                             Anesthesia Physical Anesthesia Plan  ASA: II  Anesthesia Plan: General   Post-op Pain Management:    Induction: Intravenous  Airway Management Planned: LMA  Additional Equipment: None  Intra-op Plan:   Post-operative Plan: Extubation in OR  Informed Consent: I have reviewed the patients History and Physical, chart, labs and discussed the procedure including the risks, benefits and alternatives for the proposed anesthesia with the patient or authorized representative who has indicated his/her understanding and acceptance.   Dental advisory given  Plan Discussed with: CRNA and Surgeon  Anesthesia Plan Comments:         Anesthesia Quick Evaluation

## 2016-01-02 NOTE — Discharge Instructions (Signed)
Foley Catheter Care, Adult A Foley catheter is a soft, flexible tube. This tube is placed into your bladder to drain pee (urine). If you go home with this catheter in place, follow the instructions below. TAKING CARE OF THE CATHETER 1. Wash your hands with soap and water. 2. Put soap and water on a clean washcloth.  Clean the skin where the tube goes into your body.  Clean away from the tube site.  Never wipe toward the tube.  Clean the area using a circular motion.  Remove all the soap. Pat the area dry with a clean towel. For males, reposition the skin that covers the end of the penis (foreskin). 3. Attach the tube to your leg with tape or a leg strap. Do not stretch the tube tight. If you are using tape, remove any stickiness left behind by past tape you used. 4. Keep the drainage bag below your hips. Keep it off the floor. 5. Check your tube during the day. Make sure it is working and draining. Make sure the tube does not curl, twist, or bend. 6. Do not pull on the tube or try to take it out. TAKING CARE OF THE DRAINAGE BAGS You will have a large overnight drainage bag and a small leg bag. You may wear the overnight bag any time. Never wear the small bag at night. Follow the directions below. Emptying the Drainage Bag Empty your drainage bag when it is  - full or at least 2-3 times a day. 1. Wash your hands with soap and water. 2. Keep the drainage bag below your hips. 3. Hold the dirty bag over the toilet or clean container. 4. Open the pour spout at the bottom of the bag. Empty the pee into the toilet or container. Do not let the pour spout touch anything. 5. Clean the pour spout with a gauze pad or cotton ball that has rubbing alcohol on it. 6. Close the pour spout. 7. Attach the bag to your leg with tape or a leg strap. 8. Wash your hands well. Changing the Drainage Bag Change your bag once a month or sooner if it starts to smell or look dirty.  1. Wash your hands with soap  and water. 2. Pinch the rubber tube so that pee does not spill out. 3. Disconnect the catheter tube from the drainage tube at the connection valve. Do not let the tubes touch anything. 4. Clean the end of the catheter tube with an alcohol wipe. Clean the end of a the drainage tube with a different alcohol wipe. 5. Connect the catheter tube to the drainage tube of the clean drainage bag. 6. Attach the new bag to the leg with tape or a leg strap. Avoid attaching the new bag too tightly. 7. Wash your hands well. Cleaning the Drainage Bag 1. Wash your hands with soap and water. 2. Wash the bag in warm, soapy water. 3. Rinse the bag with warm water. 4. Fill the bag with a mixture of white vinegar and water (1 cup vinegar to 1 quart warm water [.2 liter vinegar to 1 liter warm water]). Close the bag and soak it for 30 minutes in the solution. 5. Rinse the bag with warm water. 6. Hang the bag to dry with the pour spout open and hanging downward. 7. Store the clean bag (once it is dry) in a clean plastic bag. 8. Wash your hands well. PREVENT INFECTION  Wash your hands before and after touching your tube.  Take showers every day. Wash the skin where the tube enters your body. Do not take baths. Replace wet leg straps with dry ones, if this applies. °· Do not use powders, sprays, or lotions on the genital area. Only use creams, lotions, or ointments as told by your doctor. °· For females, wipe from front to back after going to the bathroom. °· Drink enough fluids to keep your pee clear or pale yellow unless you are told not to have too much fluid (fluid restriction). °· Do not let the drainage bag or tubing touch or lie on the floor. °· Wear cotton underwear to keep the area dry. °GET HELP IF: °· Your pee is cloudy or smells unusually bad. °· Your tube becomes clogged. °· You are not draining pee into the bag or your bladder feels full. °· Your tube starts to leak. °GET HELP RIGHT AWAY IF: °· You have  pain, puffiness (swelling), redness, or yellowish-white fluid (pus) where the tube enters the body. °· You have pain in the belly (abdomen), legs, lower back, or bladder. °· You have a fever. °· You see blood fill the tube, or your pee is pink or red. °· You feel sick to your stomach (nauseous), throw up (vomit), or have chills. °· Your tube gets pulled out. °MAKE SURE YOU:  °· Understand these instructions. °· Will watch your condition. °· Will get help right away if you are not doing well or get worse. °  °This information is not intended to replace advice given to you by your health care provider. Make sure you discuss any questions you have with your health care provider. °  °Document Released: 01/29/2013 Document Revised: 10/25/2014 Document Reviewed: 01/29/2013 °Elsevier Interactive Patient Education ©2016 [employer] °Transurethral Resection of the Prostate, Care After °Refer to this sheet in the next few weeks. These instructions provide you with information on caring for yourself after your procedure. Your caregiver also may give you specific instructions. Your treatment has been planned according to current medical practices, but complications sometimes occur. Call your caregiver if you have any problems or questions after your procedure. °HOME CARE INSTRUCTIONS  °Recovery can take 4-6 weeks. Avoid alcohol, caffeinated drinks, and spicy foods for 2 weeks after your procedure. Drink enough fluids to keep your urine clear or pale yellow. Urinate as soon as you feel the urge to do so. Do not try to hold your urine for long periods of time. °During recovery you may experience pain caused by bladder spasms, which result in a very intense urge to urinate. Take all medicines as directed by your caregiver, including medicines for pain. Try to limit the amount of pain medicines you take because it can cause constipation. If you do become constipated, do not strain to move your bowels. Straining can increase  bleeding. Constipation can be minimized by increasing the amount fluids and fiber in your diet. Your caregiver also may prescribe a stool softener. °Do not lift heavy objects (more than 5 lb [2.25 kg]) or perform exercises that cause you to strain for at least 1 month after your procedure. When sitting, you may want to sit in a soft chair or use a cushion. For the first 10 days after your procedure, avoid the following activities: °7. Running. °8. Strenuous work. °9. Long walks. °10. Riding in a car for extended periods. °11. Sex. °SEEK MEDICAL CARE IF: °9. You have difficulty urinating. °10. You have blood in your urine that does not go away after you   rest or increase your fluid intake. °11. You have swelling in your penis or scrotum. °SEEK IMMEDIATE MEDICAL CARE IF:  °8. You are suddenly unable to urinate. °9. You notice blood clots in your urine. °10. You have chills. °11. You have a fever. °12. You have pain in your back or lower abdomen. °13. You have pain or swelling in your legs. °MAKE SURE YOU:  °9. Understand these instructions. °10. Will watch your condition. °11. Will get help right away if you are not doing well or get worse. °  °This information is not intended to replace advice given to you by your health care provider. Make sure you discuss any questions you have with your health care provider. °  °Document Released: 10/04/2005 Document Revised: 10/25/2014 Document Reviewed: 11/12/2011 °Elsevier Interactive Patient Education ©2016 [employer] ° °Post Anesthesia Home Care Instructions ° °Activity: °Get plenty of rest for the remainder of the day. A responsible adult should stay with you for 24 hours following the procedure.  °For the next 24 hours, DO NOT: °-Drive a car °-Operate machinery °-Drink alcoholic beverages °-Take any medication unless instructed by your physician °-Make any legal decisions or sign important papers. ° °Meals: °Start with liquid foods such as gelatin or soup. Progress to  regular foods as tolerated. Avoid greasy, spicy, heavy foods. If nausea and/or vomiting occur, drink only clear liquids until the nausea and/or vomiting subsides. Call your physician if vomiting continues. ° °Special Instructions/Symptoms: °Your throat may feel dry or sore from the anesthesia or the breathing tube placed in your throat during surgery. If this causes discomfort, gargle with warm salt water. The discomfort should disappear within 24 hours. ° °If you had a scopolamine patch placed behind your ear for the management of post- operative nausea and/or vomiting: ° °1. The medication in the patch is effective for 72 hours, after which it should be removed.  Wrap patch in a tissue and discard in the trash. Wash hands thoroughly with soap and water. °2. You may remove the patch earlier than 72 hours if you experience unpleasant side effects which may include dry mouth, dizziness or visual disturbances. °3. Avoid touching the patch. Wash your hands with soap and water after contact with the patch. °  ° °

## 2016-01-06 ENCOUNTER — Encounter (HOSPITAL_BASED_OUTPATIENT_CLINIC_OR_DEPARTMENT_OTHER): Payer: Self-pay | Admitting: Urology

## 2016-01-07 DIAGNOSIS — J3089 Other allergic rhinitis: Secondary | ICD-10-CM | POA: Diagnosis not present

## 2016-01-12 DIAGNOSIS — J3089 Other allergic rhinitis: Secondary | ICD-10-CM | POA: Diagnosis not present

## 2016-01-20 DIAGNOSIS — J3089 Other allergic rhinitis: Secondary | ICD-10-CM | POA: Diagnosis not present

## 2016-01-27 DIAGNOSIS — J3089 Other allergic rhinitis: Secondary | ICD-10-CM | POA: Diagnosis not present

## 2016-02-03 DIAGNOSIS — N3001 Acute cystitis with hematuria: Secondary | ICD-10-CM | POA: Diagnosis not present

## 2016-02-03 DIAGNOSIS — R300 Dysuria: Secondary | ICD-10-CM | POA: Diagnosis not present

## 2016-02-03 DIAGNOSIS — N059 Unspecified nephritic syndrome with unspecified morphologic changes: Secondary | ICD-10-CM | POA: Diagnosis not present

## 2016-02-17 DIAGNOSIS — J3089 Other allergic rhinitis: Secondary | ICD-10-CM | POA: Diagnosis not present

## 2016-02-23 DIAGNOSIS — J3089 Other allergic rhinitis: Secondary | ICD-10-CM | POA: Diagnosis not present

## 2016-02-23 DIAGNOSIS — J301 Allergic rhinitis due to pollen: Secondary | ICD-10-CM | POA: Diagnosis not present

## 2016-02-23 DIAGNOSIS — Z125 Encounter for screening for malignant neoplasm of prostate: Secondary | ICD-10-CM | POA: Diagnosis not present

## 2016-02-23 DIAGNOSIS — E119 Type 2 diabetes mellitus without complications: Secondary | ICD-10-CM | POA: Diagnosis not present

## 2016-02-23 DIAGNOSIS — I10 Essential (primary) hypertension: Secondary | ICD-10-CM | POA: Diagnosis not present

## 2016-02-23 DIAGNOSIS — N390 Urinary tract infection, site not specified: Secondary | ICD-10-CM | POA: Diagnosis not present

## 2016-02-23 DIAGNOSIS — E7800 Pure hypercholesterolemia, unspecified: Secondary | ICD-10-CM | POA: Diagnosis not present

## 2016-02-26 DIAGNOSIS — H9122 Sudden idiopathic hearing loss, left ear: Secondary | ICD-10-CM | POA: Diagnosis not present

## 2016-02-26 DIAGNOSIS — I10 Essential (primary) hypertension: Secondary | ICD-10-CM | POA: Diagnosis not present

## 2016-02-26 DIAGNOSIS — H903 Sensorineural hearing loss, bilateral: Secondary | ICD-10-CM | POA: Diagnosis not present

## 2016-02-26 DIAGNOSIS — E7800 Pure hypercholesterolemia, unspecified: Secondary | ICD-10-CM | POA: Diagnosis not present

## 2016-02-26 DIAGNOSIS — H7411 Adhesive right middle ear disease: Secondary | ICD-10-CM | POA: Diagnosis not present

## 2016-02-26 DIAGNOSIS — E119 Type 2 diabetes mellitus without complications: Secondary | ICD-10-CM | POA: Diagnosis not present

## 2016-02-26 DIAGNOSIS — H906 Mixed conductive and sensorineural hearing loss, bilateral: Secondary | ICD-10-CM | POA: Diagnosis not present

## 2016-03-03 DIAGNOSIS — J3089 Other allergic rhinitis: Secondary | ICD-10-CM | POA: Diagnosis not present

## 2016-03-08 DIAGNOSIS — J3089 Other allergic rhinitis: Secondary | ICD-10-CM | POA: Diagnosis not present

## 2016-03-16 DIAGNOSIS — J3089 Other allergic rhinitis: Secondary | ICD-10-CM | POA: Diagnosis not present

## 2016-03-24 DIAGNOSIS — J4530 Mild persistent asthma, uncomplicated: Secondary | ICD-10-CM | POA: Diagnosis not present

## 2016-03-24 DIAGNOSIS — J3089 Other allergic rhinitis: Secondary | ICD-10-CM | POA: Diagnosis not present

## 2016-03-25 DIAGNOSIS — J3089 Other allergic rhinitis: Secondary | ICD-10-CM | POA: Diagnosis not present

## 2016-03-30 DIAGNOSIS — J3089 Other allergic rhinitis: Secondary | ICD-10-CM | POA: Diagnosis not present

## 2016-04-05 DIAGNOSIS — R3914 Feeling of incomplete bladder emptying: Secondary | ICD-10-CM | POA: Diagnosis not present

## 2016-04-05 DIAGNOSIS — R972 Elevated prostate specific antigen [PSA]: Secondary | ICD-10-CM | POA: Diagnosis not present

## 2016-04-05 DIAGNOSIS — J3089 Other allergic rhinitis: Secondary | ICD-10-CM | POA: Diagnosis not present

## 2016-04-05 DIAGNOSIS — N401 Enlarged prostate with lower urinary tract symptoms: Secondary | ICD-10-CM | POA: Diagnosis not present

## 2016-04-12 DIAGNOSIS — J3089 Other allergic rhinitis: Secondary | ICD-10-CM | POA: Diagnosis not present

## 2016-04-23 DIAGNOSIS — J3089 Other allergic rhinitis: Secondary | ICD-10-CM | POA: Diagnosis not present

## 2016-04-28 DIAGNOSIS — J3089 Other allergic rhinitis: Secondary | ICD-10-CM | POA: Diagnosis not present

## 2016-05-25 DIAGNOSIS — J3089 Other allergic rhinitis: Secondary | ICD-10-CM | POA: Diagnosis not present

## 2016-05-31 DIAGNOSIS — J3089 Other allergic rhinitis: Secondary | ICD-10-CM | POA: Diagnosis not present

## 2016-06-02 DIAGNOSIS — J3089 Other allergic rhinitis: Secondary | ICD-10-CM | POA: Diagnosis not present

## 2016-06-14 DIAGNOSIS — J3089 Other allergic rhinitis: Secondary | ICD-10-CM | POA: Diagnosis not present

## 2016-06-17 DIAGNOSIS — J3089 Other allergic rhinitis: Secondary | ICD-10-CM | POA: Diagnosis not present

## 2016-06-22 DIAGNOSIS — E119 Type 2 diabetes mellitus without complications: Secondary | ICD-10-CM | POA: Diagnosis not present

## 2016-06-22 DIAGNOSIS — I10 Essential (primary) hypertension: Secondary | ICD-10-CM | POA: Diagnosis not present

## 2016-06-23 DIAGNOSIS — J3089 Other allergic rhinitis: Secondary | ICD-10-CM | POA: Diagnosis not present

## 2016-06-25 DIAGNOSIS — E7800 Pure hypercholesterolemia, unspecified: Secondary | ICD-10-CM | POA: Diagnosis not present

## 2016-06-25 DIAGNOSIS — I10 Essential (primary) hypertension: Secondary | ICD-10-CM | POA: Diagnosis not present

## 2016-06-25 DIAGNOSIS — E119 Type 2 diabetes mellitus without complications: Secondary | ICD-10-CM | POA: Diagnosis not present

## 2016-06-28 DIAGNOSIS — J3089 Other allergic rhinitis: Secondary | ICD-10-CM | POA: Diagnosis not present

## 2016-07-05 DIAGNOSIS — J3089 Other allergic rhinitis: Secondary | ICD-10-CM | POA: Diagnosis not present

## 2016-07-12 DIAGNOSIS — J3089 Other allergic rhinitis: Secondary | ICD-10-CM | POA: Diagnosis not present

## 2016-08-02 DIAGNOSIS — J3089 Other allergic rhinitis: Secondary | ICD-10-CM | POA: Diagnosis not present

## 2016-08-12 DIAGNOSIS — J3089 Other allergic rhinitis: Secondary | ICD-10-CM | POA: Diagnosis not present

## 2016-08-16 DIAGNOSIS — J3089 Other allergic rhinitis: Secondary | ICD-10-CM | POA: Diagnosis not present

## 2016-08-23 DIAGNOSIS — J3089 Other allergic rhinitis: Secondary | ICD-10-CM | POA: Diagnosis not present

## 2016-08-31 DIAGNOSIS — J3089 Other allergic rhinitis: Secondary | ICD-10-CM | POA: Diagnosis not present

## 2016-09-13 DIAGNOSIS — J3089 Other allergic rhinitis: Secondary | ICD-10-CM | POA: Diagnosis not present

## 2016-09-20 DIAGNOSIS — J3089 Other allergic rhinitis: Secondary | ICD-10-CM | POA: Diagnosis not present

## 2016-09-27 DIAGNOSIS — J3089 Other allergic rhinitis: Secondary | ICD-10-CM | POA: Diagnosis not present

## 2016-10-04 DIAGNOSIS — J3089 Other allergic rhinitis: Secondary | ICD-10-CM | POA: Diagnosis not present

## 2016-10-12 DIAGNOSIS — J3089 Other allergic rhinitis: Secondary | ICD-10-CM | POA: Diagnosis not present

## 2016-10-14 DIAGNOSIS — J3089 Other allergic rhinitis: Secondary | ICD-10-CM | POA: Diagnosis not present

## 2016-10-20 DIAGNOSIS — J3089 Other allergic rhinitis: Secondary | ICD-10-CM | POA: Diagnosis not present

## 2016-11-01 DIAGNOSIS — J3089 Other allergic rhinitis: Secondary | ICD-10-CM | POA: Diagnosis not present

## 2016-11-08 DIAGNOSIS — J3089 Other allergic rhinitis: Secondary | ICD-10-CM | POA: Diagnosis not present

## 2016-11-15 DIAGNOSIS — J3089 Other allergic rhinitis: Secondary | ICD-10-CM | POA: Diagnosis not present

## 2016-11-19 DIAGNOSIS — J3089 Other allergic rhinitis: Secondary | ICD-10-CM | POA: Diagnosis not present

## 2016-11-22 DIAGNOSIS — J3089 Other allergic rhinitis: Secondary | ICD-10-CM | POA: Diagnosis not present

## 2016-11-25 DIAGNOSIS — J3089 Other allergic rhinitis: Secondary | ICD-10-CM | POA: Diagnosis not present

## 2016-11-29 DIAGNOSIS — J3089 Other allergic rhinitis: Secondary | ICD-10-CM | POA: Diagnosis not present

## 2016-12-06 DIAGNOSIS — J3089 Other allergic rhinitis: Secondary | ICD-10-CM | POA: Diagnosis not present

## 2016-12-13 DIAGNOSIS — J3089 Other allergic rhinitis: Secondary | ICD-10-CM | POA: Diagnosis not present

## 2016-12-20 DIAGNOSIS — J3089 Other allergic rhinitis: Secondary | ICD-10-CM | POA: Diagnosis not present

## 2016-12-28 DIAGNOSIS — J3089 Other allergic rhinitis: Secondary | ICD-10-CM | POA: Diagnosis not present

## 2017-01-06 DIAGNOSIS — J3089 Other allergic rhinitis: Secondary | ICD-10-CM | POA: Diagnosis not present

## 2017-01-12 DIAGNOSIS — J3089 Other allergic rhinitis: Secondary | ICD-10-CM | POA: Diagnosis not present

## 2017-01-17 DIAGNOSIS — J3089 Other allergic rhinitis: Secondary | ICD-10-CM | POA: Diagnosis not present

## 2017-01-24 DIAGNOSIS — J3089 Other allergic rhinitis: Secondary | ICD-10-CM | POA: Diagnosis not present

## 2017-01-31 DIAGNOSIS — J3089 Other allergic rhinitis: Secondary | ICD-10-CM | POA: Diagnosis not present

## 2017-02-07 DIAGNOSIS — J3089 Other allergic rhinitis: Secondary | ICD-10-CM | POA: Diagnosis not present

## 2017-03-15 DIAGNOSIS — J3089 Other allergic rhinitis: Secondary | ICD-10-CM | POA: Diagnosis not present

## 2017-03-17 DIAGNOSIS — J3089 Other allergic rhinitis: Secondary | ICD-10-CM | POA: Diagnosis not present

## 2017-03-21 DIAGNOSIS — J3089 Other allergic rhinitis: Secondary | ICD-10-CM | POA: Diagnosis not present

## 2017-03-25 DIAGNOSIS — J4530 Mild persistent asthma, uncomplicated: Secondary | ICD-10-CM | POA: Diagnosis not present

## 2017-03-25 DIAGNOSIS — J3089 Other allergic rhinitis: Secondary | ICD-10-CM | POA: Diagnosis not present

## 2017-04-01 DIAGNOSIS — J3089 Other allergic rhinitis: Secondary | ICD-10-CM | POA: Diagnosis not present

## 2017-04-04 DIAGNOSIS — J3089 Other allergic rhinitis: Secondary | ICD-10-CM | POA: Diagnosis not present

## 2017-04-11 DIAGNOSIS — J3089 Other allergic rhinitis: Secondary | ICD-10-CM | POA: Diagnosis not present

## 2017-04-28 DIAGNOSIS — J3089 Other allergic rhinitis: Secondary | ICD-10-CM | POA: Diagnosis not present

## 2017-04-28 DIAGNOSIS — J4530 Mild persistent asthma, uncomplicated: Secondary | ICD-10-CM | POA: Diagnosis not present

## 2017-07-07 DIAGNOSIS — E119 Type 2 diabetes mellitus without complications: Secondary | ICD-10-CM | POA: Diagnosis not present

## 2017-07-07 DIAGNOSIS — I10 Essential (primary) hypertension: Secondary | ICD-10-CM | POA: Diagnosis not present

## 2017-07-12 DIAGNOSIS — H903 Sensorineural hearing loss, bilateral: Secondary | ICD-10-CM | POA: Diagnosis not present

## 2017-07-14 DIAGNOSIS — Z23 Encounter for immunization: Secondary | ICD-10-CM | POA: Diagnosis not present

## 2017-07-14 DIAGNOSIS — I10 Essential (primary) hypertension: Secondary | ICD-10-CM | POA: Diagnosis not present

## 2017-07-14 DIAGNOSIS — E7800 Pure hypercholesterolemia, unspecified: Secondary | ICD-10-CM | POA: Diagnosis not present

## 2017-07-14 DIAGNOSIS — Z0000 Encounter for general adult medical examination without abnormal findings: Secondary | ICD-10-CM | POA: Diagnosis not present

## 2017-07-14 DIAGNOSIS — E119 Type 2 diabetes mellitus without complications: Secondary | ICD-10-CM | POA: Diagnosis not present

## 2017-07-29 DIAGNOSIS — R310 Gross hematuria: Secondary | ICD-10-CM | POA: Diagnosis not present

## 2017-07-29 DIAGNOSIS — R972 Elevated prostate specific antigen [PSA]: Secondary | ICD-10-CM | POA: Diagnosis not present

## 2017-07-29 DIAGNOSIS — N401 Enlarged prostate with lower urinary tract symptoms: Secondary | ICD-10-CM | POA: Diagnosis not present

## 2017-07-29 DIAGNOSIS — R3912 Poor urinary stream: Secondary | ICD-10-CM | POA: Diagnosis not present

## 2017-07-29 DIAGNOSIS — R3121 Asymptomatic microscopic hematuria: Secondary | ICD-10-CM | POA: Diagnosis not present

## 2017-09-24 DIAGNOSIS — H9202 Otalgia, left ear: Secondary | ICD-10-CM | POA: Diagnosis not present

## 2017-09-24 DIAGNOSIS — H60399 Other infective otitis externa, unspecified ear: Secondary | ICD-10-CM | POA: Diagnosis not present

## 2017-10-28 DIAGNOSIS — H2513 Age-related nuclear cataract, bilateral: Secondary | ICD-10-CM | POA: Diagnosis not present

## 2017-10-28 DIAGNOSIS — E119 Type 2 diabetes mellitus without complications: Secondary | ICD-10-CM | POA: Diagnosis not present

## 2017-10-28 DIAGNOSIS — H5213 Myopia, bilateral: Secondary | ICD-10-CM | POA: Diagnosis not present

## 2017-10-31 DIAGNOSIS — R21 Rash and other nonspecific skin eruption: Secondary | ICD-10-CM | POA: Diagnosis not present

## 2018-01-04 DIAGNOSIS — E7800 Pure hypercholesterolemia, unspecified: Secondary | ICD-10-CM | POA: Diagnosis not present

## 2018-01-04 DIAGNOSIS — E119 Type 2 diabetes mellitus without complications: Secondary | ICD-10-CM | POA: Diagnosis not present

## 2018-01-04 DIAGNOSIS — I10 Essential (primary) hypertension: Secondary | ICD-10-CM | POA: Diagnosis not present

## 2018-01-11 DIAGNOSIS — R3129 Other microscopic hematuria: Secondary | ICD-10-CM | POA: Diagnosis not present

## 2018-01-11 DIAGNOSIS — Z0000 Encounter for general adult medical examination without abnormal findings: Secondary | ICD-10-CM | POA: Diagnosis not present

## 2018-01-11 DIAGNOSIS — E7800 Pure hypercholesterolemia, unspecified: Secondary | ICD-10-CM | POA: Diagnosis not present

## 2018-01-11 DIAGNOSIS — E039 Hypothyroidism, unspecified: Secondary | ICD-10-CM | POA: Diagnosis not present

## 2018-01-11 DIAGNOSIS — E119 Type 2 diabetes mellitus without complications: Secondary | ICD-10-CM | POA: Diagnosis not present

## 2018-01-11 DIAGNOSIS — I10 Essential (primary) hypertension: Secondary | ICD-10-CM | POA: Diagnosis not present

## 2018-01-20 DIAGNOSIS — R972 Elevated prostate specific antigen [PSA]: Secondary | ICD-10-CM | POA: Diagnosis not present

## 2018-01-25 DIAGNOSIS — R311 Benign essential microscopic hematuria: Secondary | ICD-10-CM | POA: Diagnosis not present

## 2018-05-03 DIAGNOSIS — J4530 Mild persistent asthma, uncomplicated: Secondary | ICD-10-CM | POA: Diagnosis not present

## 2018-05-03 DIAGNOSIS — J3089 Other allergic rhinitis: Secondary | ICD-10-CM | POA: Diagnosis not present

## 2018-08-15 DIAGNOSIS — Z23 Encounter for immunization: Secondary | ICD-10-CM | POA: Diagnosis not present

## 2018-10-30 DIAGNOSIS — H5213 Myopia, bilateral: Secondary | ICD-10-CM | POA: Diagnosis not present

## 2018-10-30 DIAGNOSIS — E119 Type 2 diabetes mellitus without complications: Secondary | ICD-10-CM | POA: Diagnosis not present

## 2018-10-30 DIAGNOSIS — H2513 Age-related nuclear cataract, bilateral: Secondary | ICD-10-CM | POA: Diagnosis not present

## 2018-11-28 DIAGNOSIS — L308 Other specified dermatitis: Secondary | ICD-10-CM | POA: Diagnosis not present

## 2018-12-28 DIAGNOSIS — I10 Essential (primary) hypertension: Secondary | ICD-10-CM | POA: Diagnosis not present

## 2018-12-28 DIAGNOSIS — E7800 Pure hypercholesterolemia, unspecified: Secondary | ICD-10-CM | POA: Diagnosis not present

## 2018-12-28 DIAGNOSIS — E119 Type 2 diabetes mellitus without complications: Secondary | ICD-10-CM | POA: Diagnosis not present

## 2018-12-28 DIAGNOSIS — Z125 Encounter for screening for malignant neoplasm of prostate: Secondary | ICD-10-CM | POA: Diagnosis not present

## 2018-12-28 DIAGNOSIS — E039 Hypothyroidism, unspecified: Secondary | ICD-10-CM | POA: Diagnosis not present

## 2019-01-04 DIAGNOSIS — I10 Essential (primary) hypertension: Secondary | ICD-10-CM | POA: Diagnosis not present

## 2019-01-04 DIAGNOSIS — Z1211 Encounter for screening for malignant neoplasm of colon: Secondary | ICD-10-CM | POA: Diagnosis not present

## 2019-01-04 DIAGNOSIS — R972 Elevated prostate specific antigen [PSA]: Secondary | ICD-10-CM | POA: Diagnosis not present

## 2019-01-04 DIAGNOSIS — Z0000 Encounter for general adult medical examination without abnormal findings: Secondary | ICD-10-CM | POA: Diagnosis not present

## 2019-01-04 DIAGNOSIS — E119 Type 2 diabetes mellitus without complications: Secondary | ICD-10-CM | POA: Diagnosis not present

## 2019-01-04 DIAGNOSIS — E039 Hypothyroidism, unspecified: Secondary | ICD-10-CM | POA: Diagnosis not present

## 2019-01-04 DIAGNOSIS — E7800 Pure hypercholesterolemia, unspecified: Secondary | ICD-10-CM | POA: Diagnosis not present

## 2019-01-08 DIAGNOSIS — R311 Benign essential microscopic hematuria: Secondary | ICD-10-CM | POA: Diagnosis not present

## 2019-01-08 DIAGNOSIS — N400 Enlarged prostate without lower urinary tract symptoms: Secondary | ICD-10-CM | POA: Diagnosis not present

## 2019-01-08 DIAGNOSIS — R972 Elevated prostate specific antigen [PSA]: Secondary | ICD-10-CM | POA: Diagnosis not present

## 2019-01-10 DIAGNOSIS — Z1212 Encounter for screening for malignant neoplasm of rectum: Secondary | ICD-10-CM | POA: Diagnosis not present

## 2019-01-10 DIAGNOSIS — Z1211 Encounter for screening for malignant neoplasm of colon: Secondary | ICD-10-CM | POA: Diagnosis not present

## 2019-03-22 DIAGNOSIS — H6983 Other specified disorders of Eustachian tube, bilateral: Secondary | ICD-10-CM | POA: Diagnosis not present

## 2019-03-22 DIAGNOSIS — H903 Sensorineural hearing loss, bilateral: Secondary | ICD-10-CM | POA: Diagnosis not present

## 2019-03-22 DIAGNOSIS — H6123 Impacted cerumen, bilateral: Secondary | ICD-10-CM | POA: Diagnosis not present

## 2019-04-05 DIAGNOSIS — R972 Elevated prostate specific antigen [PSA]: Secondary | ICD-10-CM | POA: Diagnosis not present

## 2019-04-12 DIAGNOSIS — N281 Cyst of kidney, acquired: Secondary | ICD-10-CM | POA: Diagnosis not present

## 2019-04-12 DIAGNOSIS — R311 Benign essential microscopic hematuria: Secondary | ICD-10-CM | POA: Diagnosis not present

## 2019-04-12 DIAGNOSIS — N400 Enlarged prostate without lower urinary tract symptoms: Secondary | ICD-10-CM | POA: Diagnosis not present

## 2019-04-12 DIAGNOSIS — R3914 Feeling of incomplete bladder emptying: Secondary | ICD-10-CM | POA: Diagnosis not present

## 2019-05-17 ENCOUNTER — Other Ambulatory Visit: Payer: Self-pay

## 2019-06-28 DIAGNOSIS — E7800 Pure hypercholesterolemia, unspecified: Secondary | ICD-10-CM | POA: Diagnosis not present

## 2019-06-28 DIAGNOSIS — I10 Essential (primary) hypertension: Secondary | ICD-10-CM | POA: Diagnosis not present

## 2019-07-02 DIAGNOSIS — J3089 Other allergic rhinitis: Secondary | ICD-10-CM | POA: Diagnosis not present

## 2019-07-02 DIAGNOSIS — J4530 Mild persistent asthma, uncomplicated: Secondary | ICD-10-CM | POA: Diagnosis not present

## 2019-07-05 DIAGNOSIS — I10 Essential (primary) hypertension: Secondary | ICD-10-CM | POA: Diagnosis not present

## 2019-07-05 DIAGNOSIS — Z0000 Encounter for general adult medical examination without abnormal findings: Secondary | ICD-10-CM | POA: Diagnosis not present

## 2019-07-05 DIAGNOSIS — E119 Type 2 diabetes mellitus without complications: Secondary | ICD-10-CM | POA: Diagnosis not present

## 2019-07-05 DIAGNOSIS — E7800 Pure hypercholesterolemia, unspecified: Secondary | ICD-10-CM | POA: Diagnosis not present

## 2019-07-26 DIAGNOSIS — Z23 Encounter for immunization: Secondary | ICD-10-CM | POA: Diagnosis not present

## 2019-08-29 DIAGNOSIS — L245 Irritant contact dermatitis due to other chemical products: Secondary | ICD-10-CM | POA: Diagnosis not present

## 2019-10-29 DIAGNOSIS — U071 COVID-19: Secondary | ICD-10-CM | POA: Diagnosis not present

## 2019-10-29 DIAGNOSIS — R062 Wheezing: Secondary | ICD-10-CM | POA: Diagnosis not present

## 2019-11-13 DIAGNOSIS — J45909 Unspecified asthma, uncomplicated: Secondary | ICD-10-CM | POA: Diagnosis not present

## 2019-11-13 DIAGNOSIS — E119 Type 2 diabetes mellitus without complications: Secondary | ICD-10-CM | POA: Diagnosis not present

## 2019-11-13 DIAGNOSIS — E7800 Pure hypercholesterolemia, unspecified: Secondary | ICD-10-CM | POA: Diagnosis not present

## 2019-11-13 DIAGNOSIS — U071 COVID-19: Secondary | ICD-10-CM | POA: Diagnosis not present

## 2019-11-13 DIAGNOSIS — I10 Essential (primary) hypertension: Secondary | ICD-10-CM | POA: Diagnosis not present

## 2019-11-13 DIAGNOSIS — G3184 Mild cognitive impairment, so stated: Secondary | ICD-10-CM | POA: Diagnosis not present

## 2019-11-26 DIAGNOSIS — E119 Type 2 diabetes mellitus without complications: Secondary | ICD-10-CM | POA: Diagnosis not present

## 2019-11-26 DIAGNOSIS — H5213 Myopia, bilateral: Secondary | ICD-10-CM | POA: Diagnosis not present

## 2019-11-26 DIAGNOSIS — H2513 Age-related nuclear cataract, bilateral: Secondary | ICD-10-CM | POA: Diagnosis not present

## 2019-12-27 DIAGNOSIS — R062 Wheezing: Secondary | ICD-10-CM | POA: Diagnosis not present

## 2019-12-27 DIAGNOSIS — J45901 Unspecified asthma with (acute) exacerbation: Secondary | ICD-10-CM | POA: Diagnosis not present

## 2020-01-03 DIAGNOSIS — U071 COVID-19: Secondary | ICD-10-CM | POA: Diagnosis not present

## 2020-01-03 DIAGNOSIS — I10 Essential (primary) hypertension: Secondary | ICD-10-CM | POA: Diagnosis not present

## 2020-01-03 DIAGNOSIS — E119 Type 2 diabetes mellitus without complications: Secondary | ICD-10-CM | POA: Diagnosis not present

## 2020-01-16 DIAGNOSIS — Z0000 Encounter for general adult medical examination without abnormal findings: Secondary | ICD-10-CM | POA: Diagnosis not present

## 2020-01-16 DIAGNOSIS — E7800 Pure hypercholesterolemia, unspecified: Secondary | ICD-10-CM | POA: Diagnosis not present

## 2020-01-16 DIAGNOSIS — I10 Essential (primary) hypertension: Secondary | ICD-10-CM | POA: Diagnosis not present

## 2020-01-16 DIAGNOSIS — E039 Hypothyroidism, unspecified: Secondary | ICD-10-CM | POA: Diagnosis not present

## 2020-01-16 DIAGNOSIS — Z8616 Personal history of COVID-19: Secondary | ICD-10-CM | POA: Diagnosis not present

## 2020-01-16 DIAGNOSIS — E119 Type 2 diabetes mellitus without complications: Secondary | ICD-10-CM | POA: Diagnosis not present

## 2020-01-28 DIAGNOSIS — L300 Nummular dermatitis: Secondary | ICD-10-CM | POA: Diagnosis not present

## 2020-02-22 DIAGNOSIS — Z20822 Contact with and (suspected) exposure to covid-19: Secondary | ICD-10-CM | POA: Diagnosis not present

## 2020-02-22 DIAGNOSIS — R062 Wheezing: Secondary | ICD-10-CM | POA: Diagnosis not present

## 2020-02-22 DIAGNOSIS — J209 Acute bronchitis, unspecified: Secondary | ICD-10-CM | POA: Diagnosis not present

## 2020-02-22 DIAGNOSIS — R0602 Shortness of breath: Secondary | ICD-10-CM | POA: Diagnosis not present

## 2020-02-22 DIAGNOSIS — R05 Cough: Secondary | ICD-10-CM | POA: Diagnosis not present

## 2020-03-03 ENCOUNTER — Other Ambulatory Visit: Payer: Self-pay

## 2020-03-04 ENCOUNTER — Ambulatory Visit (INDEPENDENT_AMBULATORY_CARE_PROVIDER_SITE_OTHER): Payer: Medicare Other | Admitting: Nurse Practitioner

## 2020-03-04 ENCOUNTER — Other Ambulatory Visit: Payer: Self-pay

## 2020-03-04 ENCOUNTER — Ambulatory Visit
Admission: RE | Admit: 2020-03-04 | Discharge: 2020-03-04 | Disposition: A | Discharge status: Home / Self Care | Payer: Medicare Other | Source: Ambulatory Visit | Attending: Nurse Practitioner | Admitting: Nurse Practitioner

## 2020-03-04 VITALS — BP 134/92 | HR 75 | Temp 97.1°F | Wt 172.0 lb

## 2020-03-04 DIAGNOSIS — R05 Cough: Secondary | ICD-10-CM | POA: Diagnosis not present

## 2020-03-04 DIAGNOSIS — Z8616 Personal history of COVID-19: Secondary | ICD-10-CM

## 2020-03-04 DIAGNOSIS — R059 Cough, unspecified: Secondary | ICD-10-CM | POA: Insufficient documentation

## 2020-03-04 DIAGNOSIS — I499 Cardiac arrhythmia, unspecified: Secondary | ICD-10-CM

## 2020-03-04 MED ORDER — OMEPRAZOLE 20 MG PO CPDR: 20.0000 mg | DELAYED_RELEASE_CAPSULE | Freq: Every day | ORAL | 3 refills | Status: DC

## 2020-03-04 MED ORDER — MONTELUKAST SODIUM 10 MG PO TABS: 10.0000 mg | ORAL_TABLET | Freq: Every day | ORAL | 3 refills | Status: AC

## 2020-03-04 NOTE — Progress Notes (Signed)
@Patient  ID: Adrian Bailey, male    DOB: [birth year], 79 y.o.   MRN: [id number]  Chief Complaint  Patient presents with  . COVID Follow Up    Tested Postitive 10/29/2019. Ongoing Cough     Referring provider: Jani Gravel, MD   79 year old male with history of diabetes, asthma, hypertension, and hyperlipidemia. Diagnosed with Covid in January.   HPI  Patient presents today for post Covid care clinic visit.  Patient was diagnosed with Covid on October 29, 2019.  He was not hospitalized for Covid.  He recovered at home with supportive measures.  He states that since he was diagnosed with Covid he has a lingering cough.  He has not had any follow-up imaging completed.  Patient's PCP has prescribed a couple rounds of prednisone and albuterol inhaler.  He was also prescribed Tessalon Perles 2 weeks ago.  Patient has been on Symbicort for years due to asthma.  He states that his cough is nonproductive but he does feel like he has a lot of mucus in his throat.  Patient's heart rhythm was noted to be irregular during exam in office today.  Patient states that he has no history of irregular heart rhythm. He denies any chest pain or palpitations. Denies f/c/s, n/v/d, hemoptysis, PND, chest pain or edema.    Note:EKG in office today showed sinus rhythm with premature atrial complexes       Allergies  Allergen Reactions  . Zithromax [Azithromycin] Other (See Comments)    "took RX; drove ~ 33min to restaurant; passed out; woke up"  . M[employer] Smuts Cough    Other reaction(s): Cough (ALLERGY/intolerance)  . Other Other (See Comments)     There is no immunization history on file for this patient.  Past Medical History:  Diagnosis Date  . Allergic rhinitis   . Asthma   . Bladder outlet obstruction   . BPH (benign prostatic hyperplasia)   . Frequency of urination   . History of TIA (transient ischemic attack)    12/ 2013--  no residual  . Hyperlipidemia   . Hypertension   . Type 2  diabetes mellitus ([place])   . Wears glasses   . Wears hearing aid    bilateral    Tobacco History: Social History   Tobacco Use  Smoking Status Never Smoker  Smokeless Tobacco Never Used   Counseling given: Not Answered   Outpatient Encounter Medications as of 03/04/2020  Medication Sig  . albuterol (PROVENTIL) (2.5 MG/3ML) 0.083% nebulizer solution Take 2.5 mg by nebulization every 6 (six) hours as needed for wheezing or shortness of breath.  Marland Kitchen amLODipine (NORVASC) 2.5 MG tablet Take 2.5 mg by mouth at bedtime.  Marland Kitchen aspirin EC 81 MG tablet Take 81 mg by mouth daily.  . benzonatate (TESSALON) 100 MG capsule Take 100 mg by mouth 3 (three) times daily as needed for cough.  . budesonide-formoterol (SYMBICORT) 160-4.5 MCG/ACT inhaler USE 2 PUFFS TWICE A DAY  . metFORMIN (GLUCOPHAGE) 500 MG tablet Take 500 mg by mouth 2 (two) times daily with a meal.   . Multiple Vitamin (MULTI-VITAMIN) tablet Take by mouth.  . simvastatin (ZOCOR) 40 MG tablet Take 40 mg by mouth every evening.  . SYMBICORT 160-4.5 MCG/ACT inhaler Inhale 2 puffs into the lungs 2 (two) times daily.   Marland Kitchen telmisartan (MICARDIS) 40 MG tablet   . montelukast (SINGULAIR) 10 MG tablet Take 1 tablet (10 mg total) by mouth at bedtime.  Marland Kitchen omeprazole (PRILOSEC) 20  MG capsule Take 1 capsule (20 mg total) by mouth daily.  . [DISCONTINUED] cetirizine (ZYRTEC) 10 MG tablet Take by mouth.  . [DISCONTINUED] Chlorpheniramine Maleate (ALLERGY PO) Take 1 tablet by mouth at bedtime.  . [DISCONTINUED] dutasteride (AVODART) 0.5 MG capsule Take 0.5 mg by mouth at bedtime.  . [DISCONTINUED] fish oil-omega-3 fatty acids 1000 MG capsule Take 2 g by mouth daily.  . [DISCONTINUED] hydrocortisone 2.5 % ointment   . [DISCONTINUED] losartan (COZAAR) 100 MG tablet   . [DISCONTINUED] memantine (NAMENDA) 5 MG tablet   . [DISCONTINUED] nitrofurantoin, macrocrystal-monohydrate, (MACROBID) 100 MG capsule Take 1 capsule (100 mg total) by mouth at bedtime.  (Patient not taking: Reported on 03/04/2020)  . [DISCONTINUED] oxybutynin (DITROPAN) 5 MG tablet Take 1 tablet (5 mg total) by mouth every 8 (eight) hours as needed for bladder spasms. (Patient not taking: Reported on 03/04/2020)  . [DISCONTINUED] PRESCRIPTION MEDICATION Allergy injection weekly  . [DISCONTINUED] Tamsulosin HCl (FLOMAX) 0.4 MG CAPS Take 0.4 mg by mouth at bedtime.  . [DISCONTINUED] traMADol (ULTRAM) 50 MG tablet Take 1 tablet (50 mg total) by mouth every 6 (six) hours as needed. (Patient not taking: Reported on 03/04/2020)  . [DISCONTINUED] valsartan (DIOVAN) 320 MG tablet Take 320 mg by mouth at bedtime.   No facility-administered encounter medications on file as of 03/04/2020.     Review of Systems  Review of Systems  Constitutional: Negative.   HENT: Negative.   Respiratory: Positive for cough. Negative for shortness of breath and wheezing.   Cardiovascular: Negative.  Negative for chest pain, palpitations and leg swelling.  Gastrointestinal: Negative.   Allergic/Immunologic: Negative.   Neurological: Negative.   Psychiatric/Behavioral: Negative.        Physical Exam  BP (!) 134/92 (BP Location: Left Arm, Patient Position: Sitting, Cuff Size: Small)   Pulse 75   Temp (!) 97.1 F (36.2 C)   Wt 172 lb (78 kg)   SpO2 96%   BMI 26.94 kg/m   Wt Readings from Last 5 Encounters:  03/04/20 172 lb (78 kg)  01/02/16 184 lb (83.5 kg)  09/21/12 200 lb 2.8 oz (90.8 kg)     Physical Exam Vitals and nursing note reviewed.  Constitutional:      General: He is not in acute distress.    Appearance: He is well-developed.  Cardiovascular:     Rate and Rhythm: Normal rate and regular rhythm.  Pulmonary:     Effort: Pulmonary effort is normal.     Breath sounds: Normal breath sounds.  Skin:    General: Skin is warm and dry.  Neurological:     Mental Status: He is alert and oriented to person, place, and time.      Lab Results:  CBC    Component Value  Date/Time   HGB 15.0 01/02/2016 0837   HCT 44.0 01/02/2016 0837    BMET    Component Value Date/Time   NA 141 01/02/2016 0837   K 4.1 01/02/2016 0837   CL 107 11/06/2010 0930   GLUCOSE 127 (H) 01/02/2016 0837   BUN 15 11/06/2010 0930   CREATININE 1.1 11/06/2010 0930    BNP No results found for: BNP  ProBNP No results found for: PROBNP  Imaging: No results found.   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  For three days don't talk, laugh, sing, or clear your throat. Do everything you can to suppress the cough during this time.  -Use hard candies (sugarless Jolly Ranchers) or non-mint or non-menthol containing cough drops during this time to soothe your throat.    - take sips of water when you feel the urge to clear your throat or cough  -Use a cough suppressant (Delsym) around the clock during this time  -After three days, gradually increase the use of your voice and back off on the cough suppressants   Irregular heart Rhythm:  EKG in office today showed sinus rhythm with premature atrial complexes     Follow up in 3 weeks or sooner if needed        Fenton Foy, NP 03/04/2020

## 2020-03-04 NOTE — Patient Instructions (Addendum)
History of Covid Chronic Cough:  Will order chest x ray and call with results  -Continue albuterol  -Continue Symbicort  -Will order Singulair   -may start mucinex  -Start gastroesophageal reflux disease treatment with elevating the head your bed and taking antacids - will order prilosec  -You need to try to suppress your cough to allow your larynx (voice box) to heal.  For three days don't talk, laugh, sing, or clear your throat. Do everything you can to suppress the cough during this time.  -Use hard candies (sugarless Jolly Ranchers) or non-mint or non-menthol containing cough drops during this time to soothe your throat.    - take sips of water when you feel the urge to clear your throat or cough  -Use a cough suppressant (Delsym) around the clock during this time  -After three days, gradually increase the use of your voice and back off on the cough suppressants   Irregular heart Rhythm:  EKG in office today showed sinus rhythm with premature atrial complexes     Follow up in 3 weeks or sooner if needed

## 2020-03-04 NOTE — Assessment & Plan Note (Signed)
Chronic Cough:  Will order chest x ray and call with results  -Continue albuterol  -Continue Symbicort  -Will order Singulair   -may start mucinex  -Start gastroesophageal reflux disease treatment with elevating the head your bed and taking antacids - will order prilosec  -You need to try to suppress your cough to allow your larynx (voice box) to heal.  For three days don't talk, laugh, sing, or clear your throat. Do everything you can to suppress the cough during this time.  -Use hard candies (sugarless Jolly Ranchers) or non-mint or non-menthol containing cough drops during this time to soothe your throat.    - take sips of water when you feel the urge to clear your throat or cough  -Use a cough suppressant (Delsym) around the clock during this time  -After three days, gradually increase the use of your voice and back off on the cough suppressants   Irregular heart Rhythm:  EKG in office today showed sinus rhythm with premature atrial complexes     Follow up in 3 weeks or sooner if needed

## 2020-03-05 NOTE — Progress Notes (Signed)
Left voicemail for callback.

## 2020-03-05 NOTE — Progress Notes (Signed)
Patient returned phone call. Results given no additional questions.

## 2020-03-28 DIAGNOSIS — L309 Dermatitis, unspecified: Secondary | ICD-10-CM | POA: Diagnosis not present

## 2020-03-28 DIAGNOSIS — L089 Local infection of the skin and subcutaneous tissue, unspecified: Secondary | ICD-10-CM | POA: Diagnosis not present

## 2020-04-08 ENCOUNTER — Other Ambulatory Visit: Payer: Self-pay | Admitting: Nurse Practitioner

## 2020-04-24 ENCOUNTER — Ambulatory Visit (INDEPENDENT_AMBULATORY_CARE_PROVIDER_SITE_OTHER): Payer: Medicare Other | Admitting: Nurse Practitioner

## 2020-04-24 ENCOUNTER — Other Ambulatory Visit: Payer: Self-pay

## 2020-04-24 ENCOUNTER — Ambulatory Visit
Admission: RE | Admit: 2020-04-24 | Discharge: 2020-04-24 | Disposition: A | Discharge status: Home / Self Care | Payer: Medicare Other | Source: Ambulatory Visit | Attending: Nurse Practitioner | Admitting: Nurse Practitioner

## 2020-04-24 VITALS — HR 78 | Temp 97.3°F | Resp 18

## 2020-04-24 DIAGNOSIS — R053 Chronic cough: Secondary | ICD-10-CM

## 2020-04-24 DIAGNOSIS — Z8616 Personal history of COVID-19: Secondary | ICD-10-CM

## 2020-04-24 DIAGNOSIS — R05 Cough: Secondary | ICD-10-CM

## 2020-04-24 DIAGNOSIS — R062 Wheezing: Secondary | ICD-10-CM

## 2020-04-24 MED ORDER — PREDNISONE 20 MG PO TABS: 20.0000 mg | ORAL_TABLET | Freq: Every day | ORAL | 0 refills | Status: AC

## 2020-04-24 MED ORDER — DOXYCYCLINE HYCLATE 100 MG PO TABS: 100.0000 mg | ORAL_TABLET | Freq: Two times a day (BID) | ORAL | 0 refills | Status: AC

## 2020-04-24 MED ORDER — SPIRIVA RESPIMAT 1.25 MCG/ACT IN AERS: 2.0000 | INHALATION_SPRAY | Freq: Every day | RESPIRATORY_TRACT | 0 refills | Status: AC

## 2020-04-24 NOTE — Patient Instructions (Addendum)
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:  Will order chest x ray and call with results  -Continue albuterol every 6 hours as needed for shortness of breath  -Continue Symbicort 2 puffs twice daily  - will trial spiriva - 2 puffs daily   - will order prednisone  - will order doxycycline  -Continue Singulair   -Continue mucinex  -continue gastroesophageal reflux disease treatment with elevating the head your bed and taking antacids - will order prilosec  -Use hard candies (sugarless Jolly Ranchers) or non-mint or non-menthol containing cough drops  - take sips of water when you feel the urge to clear your throat or cough  -Use a cough suppressant (Delsym) as needed    Follow up:  Follow up in 2 months or sooner if needed

## 2020-04-24 NOTE — Assessment & Plan Note (Addendum)
Chronic Cough:  Will order chest x ray and call with results  -Continue albuterol every 6 hours as needed for shortness of breath  -Continue Symbicort 2 puffs twice daily  - will trial spiriva - 2 puffs daily   - will order prednisone  - will order doxycycline  -Continue Singulair   -Continue mucinex  -continue gastroesophageal reflux disease treatment with elevating the head your bed and taking antacids - will order prilosec  -Use hard candies (sugarless Jolly Ranchers) or non-mint or non-menthol containing cough drops  - take sips of water when you feel the urge to clear your throat or cough  -Use a cough suppressant (Delsym) as needed    Follow up:  Follow up in 2 months or sooner if needed

## 2020-04-24 NOTE — Progress Notes (Signed)
@Patient  ID: Adrian Bailey, male    DOB: Feb 10, 1941, 79 y.o.   MRN: [id number]  Chief Complaint  Patient presents with  . Cough    Referring provider: Jani Gravel, MD   79 year old male with history of diabetes, asthma, hypertension, and hyperlipidemia. Diagnosed with Covid in January.   HPI  Patient presents today for post Covid care clinic visit follow-up.  Patient was last seen here on 03/04/2020 for chronic cough after Covid this past January.  Since last visit patient has been doing well.  He left for 3 weeks to visit his grandchildren in [place].  He states that while he was in [place] his cough was much improved.  He has noticed that in the past week his cough has gotten a little worse again and he has noticed some chest congestion.  His cough is nonproductive.  He has been compliant with albuterol, Symbicort, Singulair, and Prilosec.  He has used Mucinex and Best boy.  He denies any recent fever.  He denies any significant shortness of breath.Denies n/v/d, hemoptysis, PND, chest pain or edema.      Allergies  Allergen Reactions  . Zithromax [Azithromycin] Other (See Comments)    "took RX; drove ~ 28min to restaurant; passed out; woke up"  . Molds & Smuts Cough    Other reaction(s): Cough (ALLERGY/intolerance)  . Other Other (See Comments)     There is no immunization history on file for this patient.  Past Medical History:  Diagnosis Date  . Allergic rhinitis   . Asthma   . Bladder outlet obstruction   . BPH (benign prostatic hyperplasia)   . Frequency of urination   . History of TIA (transient ischemic attack)    12/ 2013--  no residual  . Hyperlipidemia   . Hypertension   . Type 2 diabetes mellitus ([place])   . Wears glasses   . Wears hearing aid    bilateral    Tobacco History: Social History   Tobacco Use  Smoking Status Never Smoker  Smokeless Tobacco Never Used   Counseling given: Yes   Outpatient Encounter Medications as of 04/24/2020   Medication Sig  . albuterol (PROVENTIL) (2.5 MG/3ML) 0.083% nebulizer solution Take 2.5 mg by nebulization every 6 (six) hours as needed for wheezing or shortness of breath.  Marland Kitchen amLODipine (NORVASC) 2.5 MG tablet Take 2.5 mg by mouth at bedtime.  Marland Kitchen aspirin EC 81 MG tablet Take 81 mg by mouth daily.  . benzonatate (TESSALON) 100 MG capsule Take 100 mg by mouth 3 (three) times daily as needed for cough.  . budesonide-formoterol (SYMBICORT) 160-4.5 MCG/ACT inhaler USE 2 PUFFS TWICE A DAY  . doxycycline (VIBRA-TABS) 100 MG tablet Take 1 tablet (100 mg total) by mouth 2 (two) times daily.  . metFORMIN (GLUCOPHAGE) 500 MG tablet Take 500 mg by mouth 2 (two) times daily with a meal.   . montelukast (SINGULAIR) 10 MG tablet Take 1 tablet (10 mg total) by mouth at bedtime.  . Multiple Vitamin (MULTI-VITAMIN) tablet Take by mouth.  Marland Kitchen omeprazole (PRILOSEC) 20 MG capsule Take 1 capsule (20 mg total) by mouth daily.  . predniSONE (DELTASONE) 20 MG tablet Take 1 tablet (20 mg total) by mouth daily with breakfast for 5 days.  . simvastatin (ZOCOR) 40 MG tablet Take 40 mg by mouth every evening.  . SYMBICORT 160-4.5 MCG/ACT inhaler Inhale 2 puffs into the lungs 2 (two) times daily.   Marland Kitchen telmisartan (MICARDIS) 40 MG tablet   .  Tiotropium Bromide Monohydrate (SPIRIVA RESPIMAT) 1.25 MCG/ACT AERS Inhale 2 puffs into the lungs daily.   No facility-administered encounter medications on file as of 04/24/2020.     Review of Systems  Review of Systems  Constitutional: Negative.  Negative for fatigue and fever.  HENT: Negative.   Respiratory: Positive for cough. Negative for shortness of breath.   Cardiovascular: Negative.  Negative for chest pain, palpitations and leg swelling.  Gastrointestinal: Negative.   Allergic/Immunologic: Negative.   Neurological: Negative.   Psychiatric/Behavioral: Negative.        Physical Exam  Pulse 78   Temp (!) 97.3 F (36.3 C)   Resp 18   SpO2 97% Comment: RA  Wt  Readings from Last 5 Encounters:  03/04/20 172 lb (78 kg)  01/02/16 184 lb (83.5 kg)  09/21/12 200 lb 2.8 oz (90.8 kg)     Physical Exam Vitals and nursing note reviewed.  Constitutional:      General: He is not in acute distress.    Appearance: He is well-developed.  Cardiovascular:     Rate and Rhythm: Normal rate and regular rhythm.  Pulmonary:     Effort: Pulmonary effort is normal.     Breath sounds: Examination of the right-upper field reveals wheezing. Examination of the left-upper field reveals wheezing. Examination of the right-lower field reveals wheezing. Examination of the left-lower field reveals wheezing. Wheezing present.  Skin:    General: Skin is warm and dry.  Neurological:     Mental Status: He is alert and oriented to person, place, and time.       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:  Will order chest x ray and call with results  -Continue albuterol every 6 hours as needed for shortness of breath  -Continue Symbicort 2 puffs twice daily  - will trial spiriva - 2 puffs daily   - will order prednisone  - will order doxycycline  -Continue Singulair   -Continue mucinex  -continue gastroesophageal reflux disease treatment with elevating the head your bed and taking antacids - will order prilosec  -Use hard candies (sugarless Jolly Ranchers) or non-mint or non-menthol containing cough drops  - take sips of water when you feel the urge to clear your throat or cough  -Use a cough suppressant (Delsym) as needed    Follow up:  Follow up in 2 months or sooner if needed      Fenton Foy, NP 04/24/2020

## 2020-07-09 ENCOUNTER — Other Ambulatory Visit: Payer: Self-pay | Admitting: Nurse Practitioner

## 2020-08-04 DIAGNOSIS — Z23 Encounter for immunization: Secondary | ICD-10-CM | POA: Diagnosis not present

## 2020-08-12 DIAGNOSIS — J4530 Mild persistent asthma, uncomplicated: Secondary | ICD-10-CM | POA: Diagnosis not present

## 2020-08-12 DIAGNOSIS — J3089 Other allergic rhinitis: Secondary | ICD-10-CM | POA: Diagnosis not present

## 2020-09-29 DIAGNOSIS — Z23 Encounter for immunization: Secondary | ICD-10-CM | POA: Diagnosis not present

## 2020-12-03 DIAGNOSIS — H5213 Myopia, bilateral: Secondary | ICD-10-CM | POA: Diagnosis not present

## 2020-12-03 DIAGNOSIS — H2513 Age-related nuclear cataract, bilateral: Secondary | ICD-10-CM | POA: Diagnosis not present

## 2020-12-03 DIAGNOSIS — E119 Type 2 diabetes mellitus without complications: Secondary | ICD-10-CM | POA: Diagnosis not present

## 2020-12-18 DIAGNOSIS — L300 Nummular dermatitis: Secondary | ICD-10-CM | POA: Diagnosis not present

## 2021-01-19 DIAGNOSIS — I10 Essential (primary) hypertension: Secondary | ICD-10-CM | POA: Diagnosis not present

## 2021-01-19 DIAGNOSIS — E039 Hypothyroidism, unspecified: Secondary | ICD-10-CM | POA: Diagnosis not present

## 2021-01-19 DIAGNOSIS — E119 Type 2 diabetes mellitus without complications: Secondary | ICD-10-CM | POA: Diagnosis not present

## 2021-01-19 DIAGNOSIS — E7800 Pure hypercholesterolemia, unspecified: Secondary | ICD-10-CM | POA: Diagnosis not present

## 2021-02-12 DIAGNOSIS — Z0000 Encounter for general adult medical examination without abnormal findings: Secondary | ICD-10-CM | POA: Diagnosis not present

## 2021-02-12 DIAGNOSIS — J45909 Unspecified asthma, uncomplicated: Secondary | ICD-10-CM | POA: Diagnosis not present

## 2021-02-12 DIAGNOSIS — E119 Type 2 diabetes mellitus without complications: Secondary | ICD-10-CM | POA: Diagnosis not present

## 2021-02-12 DIAGNOSIS — G3184 Mild cognitive impairment, so stated: Secondary | ICD-10-CM | POA: Diagnosis not present

## 2021-02-12 DIAGNOSIS — I10 Essential (primary) hypertension: Secondary | ICD-10-CM | POA: Diagnosis not present

## 2021-02-12 DIAGNOSIS — E7800 Pure hypercholesterolemia, unspecified: Secondary | ICD-10-CM | POA: Diagnosis not present

## 2021-08-05 DIAGNOSIS — E7800 Pure hypercholesterolemia, unspecified: Secondary | ICD-10-CM | POA: Diagnosis not present

## 2021-08-05 DIAGNOSIS — E119 Type 2 diabetes mellitus without complications: Secondary | ICD-10-CM | POA: Diagnosis not present

## 2021-08-12 DIAGNOSIS — J3089 Other allergic rhinitis: Secondary | ICD-10-CM | POA: Diagnosis not present

## 2021-08-12 DIAGNOSIS — J4530 Mild persistent asthma, uncomplicated: Secondary | ICD-10-CM | POA: Diagnosis not present

## 2021-08-13 DIAGNOSIS — E782 Mixed hyperlipidemia: Secondary | ICD-10-CM | POA: Diagnosis not present

## 2021-08-13 DIAGNOSIS — J432 Centrilobular emphysema: Secondary | ICD-10-CM | POA: Diagnosis not present

## 2021-08-13 DIAGNOSIS — Z23 Encounter for immunization: Secondary | ICD-10-CM | POA: Diagnosis not present

## 2021-08-13 DIAGNOSIS — G3184 Mild cognitive impairment, so stated: Secondary | ICD-10-CM | POA: Diagnosis not present

## 2021-08-13 DIAGNOSIS — I700 Atherosclerosis of aorta: Secondary | ICD-10-CM | POA: Diagnosis not present

## 2021-08-13 DIAGNOSIS — E1122 Type 2 diabetes mellitus with diabetic chronic kidney disease: Secondary | ICD-10-CM | POA: Diagnosis not present

## 2021-08-13 DIAGNOSIS — N1831 Chronic kidney disease, stage 3a: Secondary | ICD-10-CM | POA: Diagnosis not present

## 2022-01-14 DIAGNOSIS — B354 Tinea corporis: Secondary | ICD-10-CM | POA: Diagnosis not present

## 2022-02-12 DIAGNOSIS — E119 Type 2 diabetes mellitus without complications: Secondary | ICD-10-CM | POA: Diagnosis not present

## 2022-02-12 DIAGNOSIS — H2513 Age-related nuclear cataract, bilateral: Secondary | ICD-10-CM | POA: Diagnosis not present

## 2022-02-12 DIAGNOSIS — H5211 Myopia, right eye: Secondary | ICD-10-CM | POA: Diagnosis not present

## 2022-02-12 DIAGNOSIS — H524 Presbyopia: Secondary | ICD-10-CM | POA: Diagnosis not present

## 2022-04-15 DIAGNOSIS — F02A0 Dementia in other diseases classified elsewhere, mild, without behavioral disturbance, psychotic disturbance, mood disturbance, and anxiety: Secondary | ICD-10-CM | POA: Diagnosis not present

## 2022-04-15 DIAGNOSIS — G301 Alzheimer's disease with late onset: Secondary | ICD-10-CM | POA: Diagnosis not present

## 2022-04-15 DIAGNOSIS — Z7189 Other specified counseling: Secondary | ICD-10-CM | POA: Diagnosis not present

## 2022-04-15 DIAGNOSIS — F02B0 Dementia in other diseases classified elsewhere, moderate, without behavioral disturbance, psychotic disturbance, mood disturbance, and anxiety: Secondary | ICD-10-CM | POA: Diagnosis not present

## 2022-04-29 DIAGNOSIS — R5383 Other fatigue: Secondary | ICD-10-CM | POA: Diagnosis not present

## 2022-04-29 DIAGNOSIS — Z0000 Encounter for general adult medical examination without abnormal findings: Secondary | ICD-10-CM | POA: Diagnosis not present

## 2022-04-29 DIAGNOSIS — E782 Mixed hyperlipidemia: Secondary | ICD-10-CM | POA: Diagnosis not present

## 2022-04-29 DIAGNOSIS — Z23 Encounter for immunization: Secondary | ICD-10-CM | POA: Diagnosis not present

## 2022-04-29 DIAGNOSIS — E1122 Type 2 diabetes mellitus with diabetic chronic kidney disease: Secondary | ICD-10-CM | POA: Diagnosis not present

## 2022-04-29 DIAGNOSIS — N1831 Chronic kidney disease, stage 3a: Secondary | ICD-10-CM | POA: Diagnosis not present

## 2022-05-06 DIAGNOSIS — I700 Atherosclerosis of aorta: Secondary | ICD-10-CM | POA: Diagnosis not present

## 2022-05-06 DIAGNOSIS — F02B0 Dementia in other diseases classified elsewhere, moderate, without behavioral disturbance, psychotic disturbance, mood disturbance, and anxiety: Secondary | ICD-10-CM | POA: Diagnosis not present

## 2022-05-06 DIAGNOSIS — E782 Mixed hyperlipidemia: Secondary | ICD-10-CM | POA: Diagnosis not present

## 2022-05-06 DIAGNOSIS — J432 Centrilobular emphysema: Secondary | ICD-10-CM | POA: Diagnosis not present

## 2022-05-06 DIAGNOSIS — G301 Alzheimer's disease with late onset: Secondary | ICD-10-CM | POA: Diagnosis not present

## 2022-05-06 DIAGNOSIS — E1122 Type 2 diabetes mellitus with diabetic chronic kidney disease: Secondary | ICD-10-CM | POA: Diagnosis not present

## 2022-05-06 DIAGNOSIS — N1831 Chronic kidney disease, stage 3a: Secondary | ICD-10-CM | POA: Diagnosis not present

## 2022-07-16 DIAGNOSIS — Z23 Encounter for immunization: Secondary | ICD-10-CM | POA: Diagnosis not present

## 2022-07-22 DIAGNOSIS — G301 Alzheimer's disease with late onset: Secondary | ICD-10-CM | POA: Diagnosis not present

## 2022-07-22 DIAGNOSIS — N1831 Chronic kidney disease, stage 3a: Secondary | ICD-10-CM | POA: Diagnosis not present

## 2022-07-22 DIAGNOSIS — I129 Hypertensive chronic kidney disease with stage 1 through stage 4 chronic kidney disease, or unspecified chronic kidney disease: Secondary | ICD-10-CM | POA: Diagnosis not present

## 2022-07-22 DIAGNOSIS — F02C0 Dementia in other diseases classified elsewhere, severe, without behavioral disturbance, psychotic disturbance, mood disturbance, and anxiety: Secondary | ICD-10-CM | POA: Diagnosis not present

## 2022-07-22 DIAGNOSIS — E1122 Type 2 diabetes mellitus with diabetic chronic kidney disease: Secondary | ICD-10-CM | POA: Diagnosis not present

## 2022-07-22 DIAGNOSIS — R634 Abnormal weight loss: Secondary | ICD-10-CM | POA: Diagnosis not present

## 2022-07-22 DIAGNOSIS — Z7984 Long term (current) use of oral hypoglycemic drugs: Secondary | ICD-10-CM | POA: Diagnosis not present

## 2022-07-22 DIAGNOSIS — F02A0 Dementia in other diseases classified elsewhere, mild, without behavioral disturbance, psychotic disturbance, mood disturbance, and anxiety: Secondary | ICD-10-CM | POA: Diagnosis not present

## 2022-07-22 DIAGNOSIS — Z7189 Other specified counseling: Secondary | ICD-10-CM | POA: Diagnosis not present

## 2022-07-22 DIAGNOSIS — H906 Mixed conductive and sensorineural hearing loss, bilateral: Secondary | ICD-10-CM | POA: Diagnosis not present

## 2022-07-29 DIAGNOSIS — Z23 Encounter for immunization: Secondary | ICD-10-CM | POA: Diagnosis not present

## 2022-08-16 DIAGNOSIS — J4530 Mild persistent asthma, uncomplicated: Secondary | ICD-10-CM | POA: Diagnosis not present

## 2022-08-16 DIAGNOSIS — J3089 Other allergic rhinitis: Secondary | ICD-10-CM | POA: Diagnosis not present

## 2022-08-20 DIAGNOSIS — R051 Acute cough: Secondary | ICD-10-CM | POA: Diagnosis not present

## 2022-08-20 DIAGNOSIS — J069 Acute upper respiratory infection, unspecified: Secondary | ICD-10-CM | POA: Diagnosis not present

## 2022-08-20 DIAGNOSIS — H6691 Otitis media, unspecified, right ear: Secondary | ICD-10-CM | POA: Diagnosis not present
